# Patient Record
Sex: Male | Born: 1945 | Race: White | Hispanic: No | Marital: Married | Smoking: Never smoker
Health system: Southern US, Community
[De-identification: ages and names within clinical notes are randomized; demographics above are authoritative.]

## PROBLEM LIST (undated history)

## (undated) DIAGNOSIS — E7800 Pure hypercholesterolemia, unspecified: Secondary | ICD-10-CM

## (undated) DIAGNOSIS — G43909 Migraine, unspecified, not intractable, without status migrainosus: Secondary | ICD-10-CM

## (undated) DIAGNOSIS — R7303 Prediabetes: Secondary | ICD-10-CM

## (undated) DIAGNOSIS — K635 Polyp of colon: Secondary | ICD-10-CM

## (undated) DIAGNOSIS — G479 Sleep disorder, unspecified: Secondary | ICD-10-CM

## (undated) DIAGNOSIS — K5790 Diverticulosis of intestine, part unspecified, without perforation or abscess without bleeding: Secondary | ICD-10-CM

## (undated) DIAGNOSIS — I10 Essential (primary) hypertension: Secondary | ICD-10-CM

## (undated) DIAGNOSIS — F419 Anxiety disorder, unspecified: Secondary | ICD-10-CM

## (undated) DIAGNOSIS — I25118 Atherosclerotic heart disease of native coronary artery with other forms of angina pectoris: Secondary | ICD-10-CM

## (undated) DIAGNOSIS — R011 Cardiac murmur, unspecified: Secondary | ICD-10-CM

## (undated) HISTORY — PX: APPENDECTOMY: SHX54

## (undated) HISTORY — PX: TONSILLECTOMY: SUR1361

---
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
## 2005-05-30 ENCOUNTER — Ambulatory Visit: Payer: Self-pay | Admitting: Gastroenterology

## 2009-08-25 HISTORY — PX: COLONOSCOPY: SHX174

## 2010-07-12 ENCOUNTER — Ambulatory Visit: Payer: Self-pay | Admitting: Gastroenterology

## 2013-12-04 DIAGNOSIS — E7800 Pure hypercholesterolemia, unspecified: Secondary | ICD-10-CM | POA: Insufficient documentation

## 2013-12-04 DIAGNOSIS — R519 Headache, unspecified: Secondary | ICD-10-CM | POA: Insufficient documentation

## 2013-12-04 DIAGNOSIS — G43909 Migraine, unspecified, not intractable, without status migrainosus: Secondary | ICD-10-CM | POA: Insufficient documentation

## 2013-12-04 DIAGNOSIS — I10 Essential (primary) hypertension: Secondary | ICD-10-CM | POA: Insufficient documentation

## 2013-12-04 DIAGNOSIS — F419 Anxiety disorder, unspecified: Secondary | ICD-10-CM | POA: Insufficient documentation

## 2015-03-13 ENCOUNTER — Ambulatory Visit
Admission: RE | Admit: 2015-03-13 | Discharge: 2015-03-13 | Disposition: A | Discharge status: Home / Self Care | Payer: Medicare Other | Source: Ambulatory Visit | Attending: Cardiology | Admitting: Cardiology

## 2015-03-13 ENCOUNTER — Encounter: Payer: Self-pay | Admitting: *Deleted

## 2015-03-13 ENCOUNTER — Encounter
Admission: RE | Disposition: A | Discharge status: Home / Self Care | Payer: Self-pay | Source: Ambulatory Visit | Attending: Cardiology

## 2015-03-13 DIAGNOSIS — I2510 Atherosclerotic heart disease of native coronary artery without angina pectoris: Secondary | ICD-10-CM | POA: Diagnosis not present

## 2015-03-13 DIAGNOSIS — G43909 Migraine, unspecified, not intractable, without status migrainosus: Secondary | ICD-10-CM | POA: Diagnosis not present

## 2015-03-13 DIAGNOSIS — R011 Cardiac murmur, unspecified: Secondary | ICD-10-CM | POA: Insufficient documentation

## 2015-03-13 DIAGNOSIS — E780 Pure hypercholesterolemia: Secondary | ICD-10-CM | POA: Insufficient documentation

## 2015-03-13 DIAGNOSIS — Z79899 Other long term (current) drug therapy: Secondary | ICD-10-CM | POA: Diagnosis not present

## 2015-03-13 DIAGNOSIS — Z882 Allergy status to sulfonamides status: Secondary | ICD-10-CM | POA: Diagnosis not present

## 2015-03-13 DIAGNOSIS — Z86010 Personal history of colonic polyps: Secondary | ICD-10-CM | POA: Insufficient documentation

## 2015-03-13 DIAGNOSIS — I200 Unstable angina: Secondary | ICD-10-CM

## 2015-03-13 DIAGNOSIS — Z800 Family history of malignant neoplasm of digestive organs: Secondary | ICD-10-CM | POA: Diagnosis not present

## 2015-03-13 DIAGNOSIS — Z820 Family history of epilepsy and other diseases of the nervous system: Secondary | ICD-10-CM | POA: Insufficient documentation

## 2015-03-13 DIAGNOSIS — I10 Essential (primary) hypertension: Secondary | ICD-10-CM | POA: Insufficient documentation

## 2015-03-13 DIAGNOSIS — F419 Anxiety disorder, unspecified: Secondary | ICD-10-CM | POA: Diagnosis not present

## 2015-03-13 DIAGNOSIS — Z8041 Family history of malignant neoplasm of ovary: Secondary | ICD-10-CM | POA: Diagnosis not present

## 2015-03-13 DIAGNOSIS — Z8489 Family history of other specified conditions: Secondary | ICD-10-CM | POA: Insufficient documentation

## 2015-03-13 DIAGNOSIS — R079 Chest pain, unspecified: Secondary | ICD-10-CM | POA: Diagnosis present

## 2015-03-13 DIAGNOSIS — E785 Hyperlipidemia, unspecified: Secondary | ICD-10-CM | POA: Diagnosis not present

## 2015-03-13 HISTORY — DX: Anxiety disorder, unspecified: F41.9

## 2015-03-13 HISTORY — DX: Polyp of colon: K63.5

## 2015-03-13 HISTORY — DX: Migraine, unspecified, not intractable, without status migrainosus: G43.909

## 2015-03-13 HISTORY — PX: CARDIAC CATHETERIZATION: SHX172

## 2015-03-13 HISTORY — DX: Essential (primary) hypertension: I10

## 2015-03-13 HISTORY — DX: Cardiac murmur, unspecified: R01.1

## 2015-03-13 HISTORY — DX: Pure hypercholesterolemia, unspecified: E78.00

## 2015-03-13 SURGERY — LEFT HEART CATH
Anesthesia: Moderate Sedation

## 2015-03-13 MED ORDER — LIDOCAINE HCL (PF) 1 % IJ SOLN
INTRAMUSCULAR | Status: DC | PRN
  Administered 2015-03-13: 15 mL via SUBCUTANEOUS

## 2015-03-13 MED ORDER — SODIUM CHLORIDE 0.9 % IJ SOLN: 3.0000 mL | Freq: Two times a day (BID) | INTRAMUSCULAR | Status: DC

## 2015-03-13 MED ORDER — SODIUM CHLORIDE 0.9 % IJ SOLN: 10.0000 mL | Freq: Two times a day (BID) | INTRAMUSCULAR | Status: DC

## 2015-03-13 MED ORDER — SODIUM CHLORIDE 0.9 % IJ SOLN: 3.0000 mL | INTRAMUSCULAR | Status: DC | PRN

## 2015-03-13 MED ORDER — SODIUM CHLORIDE 0.9 % IV SOLN: 250.0000 mL | INTRAVENOUS | Status: DC | PRN

## 2015-03-13 MED ORDER — SODIUM CHLORIDE 0.9 % IV SOLN
INTRAVENOUS | Status: DC
  Administered 2015-03-13: 07:00:00 via INTRAVENOUS

## 2015-03-13 MED ORDER — MIDAZOLAM HCL 2 MG/2ML IJ SOLN
INTRAMUSCULAR | Status: AC
  Filled 2015-03-13: qty 2

## 2015-03-13 MED ORDER — SODIUM CHLORIDE 0.9 % WEIGHT BASED INFUSION: 3.0000 mL/kg/h | INTRAVENOUS | Status: DC

## 2015-03-13 MED ORDER — FENTANYL CITRATE (PF) 100 MCG/2ML IJ SOLN
INTRAMUSCULAR | Status: DC | PRN
  Administered 2015-03-13 (×2): 50 ug via INTRAVENOUS

## 2015-03-13 MED ORDER — FENTANYL CITRATE (PF) 100 MCG/2ML IJ SOLN
INTRAMUSCULAR | Status: AC
  Filled 2015-03-13: qty 2

## 2015-03-13 MED ORDER — HEPARIN (PORCINE) IN NACL 2-0.9 UNIT/ML-% IJ SOLN
INTRAMUSCULAR | Status: AC
  Filled 2015-03-13: qty 1000

## 2015-03-13 MED ORDER — ACETAMINOPHEN 325 MG PO TABS: 650.0000 mg | ORAL_TABLET | ORAL | Status: DC | PRN

## 2015-03-13 MED ORDER — ONDANSETRON HCL 4 MG/2ML IJ SOLN: 4.0000 mg | Freq: Four times a day (QID) | INTRAMUSCULAR | Status: DC | PRN

## 2015-03-13 MED ORDER — MIDAZOLAM HCL 2 MG/2ML IJ SOLN
INTRAMUSCULAR | Status: DC | PRN
  Administered 2015-03-13: 1 mg via INTRAVENOUS
  Administered 2015-03-13: 0.5 mg via INTRAVENOUS

## 2015-03-13 SURGICAL SUPPLY — 9 items
CATH INFINITI 5FR ANG PIGTAIL (CATHETERS) ×3 IMPLANT
CATH INFINITI 5FR JL4 (CATHETERS) ×3 IMPLANT
CATH INFINITI JR4 5F (CATHETERS) ×3 IMPLANT
DEVICE CLOSURE MYNXGRIP 5F (Vascular Products) ×3 IMPLANT
KIT MANI 3VAL PERCEP (MISCELLANEOUS) ×3 IMPLANT
NEEDLE PERC 18GX7CM (NEEDLE) ×3 IMPLANT
PACK CARDIAC CATH (CUSTOM PROCEDURE TRAY) ×3 IMPLANT
SHEATH AVANTI 5FR X 11CM (SHEATH) ×3 IMPLANT
WIRE EMERALD 3MM-J .035X150CM (WIRE) ×3 IMPLANT

## 2015-03-13 NOTE — Interval H&P Note (Signed)
History and Physical Interval Note:  03/13/2015 7:03 AM  Ronnie Hanson  has presented today for surgery, with the diagnosis of Chest pain  Abnormal functional study  The various methods of treatment have been discussed with the patient and family. After consideration of risks, benefits and other options for treatment, the patient has consented to  Procedure(s): Left Heart Cath (N/A) as a surgical intervention .  The patient's history has been reviewed, patient examined, no change in status, stable for surgery.  I have reviewed the patient's chart and labs.  Questions were answered to the patient's satisfaction.     Mollee Neer

## 2015-03-13 NOTE — H&P (Signed)
Progress Notes   Ronnie Hanson (MR# [id number])        Progress Notes Info     Author Note Status Last Update User Last Update Date/Time Service Date   Ronnie Cowman, MD Signed Ronnie Cowman, MD Fri Mar 02, 2015 10:03 AM Fri Mar 02, 2015 9:56 AM    Progress Notes    Expand All Collapse All   Established Patient Visit   Chief Complaint: Chief Complaint  Patient presents with  . Follow-up    ETT   Date of Service: 03/02/2015 Date of Birth: 20-Jan-1946 PCP: Ronnie Essex, III, MD  History of Present Illness: Ronnie Hanson is a 69 y.o.male patient who returns for evaluation of chest pain, essential hypertension and hyperlipidemia. The patient has a 9 month history of new onset exertional chest pain. He underwent ETT on 01/29/2015 which was stopped due to 10/10 chest pain. No ischemic ECG changes were noted. Patient continues experience recurrent episodes of chest discomfort, described as substernal chest tightness, which occurs with exertion, with such activities as mowing the lawn. Chest pain is not associated with nausea, vomiting or radiation. Episodes typically last 5 minutes. The patient denies palpitations, heart racing or peripheral edema. He is very active though he does not do any structured exercise. The patient underwent ETT sestamibi study on 02/21/2015 and exercised 5 minutes on a Bruce protocol with chest discomfort. Gated scintigraphy revealed LV ejection fraction 64%. SPECT imaging revealed evidence for mild to moderate inferior wall ischemia.  The patient has essential hypertension, blood pressure appears to be under good control, currently on atenolol/chlorthalidone. The patient follows a low-sodium, no added salt diet.  The patient has hyperlipidemia, LDL cholesterol was 90, on 01/09/2015, currently on atorvastatin, which is tolerated well without apparent side effects. The patient follows a low-cholesterol diet.  Past  Medical and Surgical History  Past Medical History Past Medical History  Diagnosis Date  . Heart murmur   . Migraine   . Hypertension   . Colonic polyp   . Anxiety     secondary to stress at work  . Hypercholesteremia     Past Surgical History He has past surgical history that includes Appendectomy and Tonsillectomy.   Medications and Allergies  Current Medications   Current Medications    Current Outpatient Prescriptions  Medication Sig Dispense Refill  . ALPRAZolam (XANAX) 0.25 MG tablet Take 1 tablet (0.25 mg total) by mouth nightly as needed for Sleep. 30 tablet 0  . atenolol-chlorthalidone (TENORETIC) 50-25 mg tablet Take 1 tablet by mouth once daily. 90 tablet 3  . HYDROcodone-acetaminophen (NORCO) 5-325 mg tablet Take by mouth as needed for Pain (rarely use).     . sertraline (ZOLOFT) 50 MG tablet Take 1 tablet (50 mg total) by mouth once daily. 15 tablet 0  . simvastatin (ZOCOR) 20 MG tablet Take 1 tablet (20 mg total) by mouth once daily. NOTE NEW DOSE NOW DOWN TO 20MG  INSTEAD OF 40MG  90 tablet 3   No current facility-administered medications for this visit.      Allergies: Sulfa (sulfonamide antibiotics)  Social and Family History  Social History  reports that he has never smoked. He does not have any smokeless tobacco history on file. He reports that he does not drink alcohol.  Family History Family History  Problem Relation Age of Onset  . Colon cancer Mother   . Alzheimer's disease Mother   . Ovarian cancer Mother   . Parkinsonism Father     Review of  Systems   Review of Systems: The patient reports recurrent exertional chest pain, without shortness of breath, orthopnea, paroxysmal nocturnal dyspnea, pedal edema, palpitations, heart racing, presyncope, syncope. Review of 12 Systems is negative except as described above.  Physical Examination   Vitals:BP 132/62  mmHg  Pulse 74  Ht 170.2 cm (5\' 7" )  Wt 70.035 kg (154 lb 6.4 oz)  BMI 24.18 kg/m2 Ht:170.2 cm (5\' 7" ) Wt:70.035 kg (154 lb 6.4 oz) PHX:TAVW surface area is 1.82 meters squared. Body mass index is 24.18 kg/(m^2).  HEENT: Pupils equally reactive to light and accomodation Neck: Supple without thyromegaly, carotid pulses 2+ Lungs: clear to auscultation bilaterally; no wheezes, rales, rhonchi Heart: Regular rate and rhythm. No gallops, murmurs or rub Abdomen: soft nontender, nondistended, with normal bowel sounds Extremities: no cyanosis, clubbing, or edema Peripheral Pulses: 2+ in all extremities, 2+ femoral pulses bilaterally  Assessment   69 y.o. male with  1. Essential hypertension, benign   2. Hypercholesteremia   3. Chest pain with high risk for cardiac etiology   4. Pre-op examination    69 year old gentleman with multiple cardiovascular risk factors, new onset exertional chest pain with ETT sestamibi study revealing evidence for mild to moderate inferior wall ischemia. The patient has Ronnie Hanson class 2-3 chest pain with moderate risk stress test. Patient has essential hypertension, blood pressure appears to be under good control current BP medications. The patient's hyperlipidemia, with good control of LDL cholesterol on simvastatin.   Plan   1. Continue current medications 2. Proceed with cardiac catheterization with selective coronary arteriography. We discussed the risks, benefits and alternatives of cardiac catheterization, as well as, the potential for percutaneous coronary intervention and coronary stenting. The patient agrees and informed written consent was obtained. 3. Counseled patient about low-sodium diet 4. Ronnie Hanson given to the patient 5. Counseled patient about low-cholesterol diet 6. Continue simvastatin for hyperlipidemia management 7. Low-fat and cholesterol diet printed Hanson given to the patient 8. Return  to clinic following cardiac catheterization and potential PCI Orders Placed This Encounter  Procedures  . Complete Blood Count (CBC)  . Basic Metabolic Panel (BMP)    No Follow-up on file.  Ronnie Hanson, [facility]   [address] [place] [address]    Service Location    Name Address       [address] [place] [address]      Department    Name Address Phone Fax   [facility] [place] [address] [phone]

## 2015-03-20 DIAGNOSIS — Z9889 Other specified postprocedural states: Secondary | ICD-10-CM | POA: Insufficient documentation

## 2015-07-04 DIAGNOSIS — I25118 Atherosclerotic heart disease of native coronary artery with other forms of angina pectoris: Secondary | ICD-10-CM | POA: Insufficient documentation

## 2015-07-27 ENCOUNTER — Other Ambulatory Visit: Payer: Self-pay

## 2015-07-27 NOTE — Telephone Encounter (Signed)
Please advise as prescription was faxed from [facility] for this medication yet it is not on the patient's list.  Unsure on the dose and strengths.  Thanks

## 2015-07-27 NOTE — Telephone Encounter (Signed)
I have not ever seen this pt.

## 2015-09-06 ENCOUNTER — Encounter: Payer: Self-pay | Admitting: *Deleted

## 2015-09-07 ENCOUNTER — Ambulatory Visit: Payer: Medicare Other | Admitting: Certified Registered"

## 2015-09-07 ENCOUNTER — Ambulatory Visit
Admission: RE | Admit: 2015-09-07 | Discharge: 2015-09-07 | Disposition: A | Discharge status: Home / Self Care | Payer: Medicare Other | Source: Ambulatory Visit | Attending: Gastroenterology | Admitting: Gastroenterology

## 2015-09-07 ENCOUNTER — Encounter
Admission: RE | Disposition: A | Discharge status: Home / Self Care | Payer: Self-pay | Source: Ambulatory Visit | Attending: Gastroenterology

## 2015-09-07 DIAGNOSIS — K5730 Diverticulosis of large intestine without perforation or abscess without bleeding: Secondary | ICD-10-CM | POA: Insufficient documentation

## 2015-09-07 DIAGNOSIS — Z820 Family history of epilepsy and other diseases of the nervous system: Secondary | ICD-10-CM | POA: Insufficient documentation

## 2015-09-07 DIAGNOSIS — Z86010 Personal history of colonic polyps: Secondary | ICD-10-CM | POA: Insufficient documentation

## 2015-09-07 DIAGNOSIS — D123 Benign neoplasm of transverse colon: Secondary | ICD-10-CM | POA: Insufficient documentation

## 2015-09-07 DIAGNOSIS — Z7982 Long term (current) use of aspirin: Secondary | ICD-10-CM | POA: Diagnosis not present

## 2015-09-07 DIAGNOSIS — I10 Essential (primary) hypertension: Secondary | ICD-10-CM | POA: Insufficient documentation

## 2015-09-07 DIAGNOSIS — Z882 Allergy status to sulfonamides status: Secondary | ICD-10-CM | POA: Diagnosis not present

## 2015-09-07 DIAGNOSIS — E7800 Pure hypercholesterolemia, unspecified: Secondary | ICD-10-CM | POA: Insufficient documentation

## 2015-09-07 DIAGNOSIS — Z800 Family history of malignant neoplasm of digestive organs: Secondary | ICD-10-CM | POA: Insufficient documentation

## 2015-09-07 DIAGNOSIS — F419 Anxiety disorder, unspecified: Secondary | ICD-10-CM | POA: Diagnosis not present

## 2015-09-07 DIAGNOSIS — Z8041 Family history of malignant neoplasm of ovary: Secondary | ICD-10-CM | POA: Diagnosis not present

## 2015-09-07 DIAGNOSIS — Z79899 Other long term (current) drug therapy: Secondary | ICD-10-CM | POA: Diagnosis not present

## 2015-09-07 DIAGNOSIS — R011 Cardiac murmur, unspecified: Secondary | ICD-10-CM | POA: Diagnosis not present

## 2015-09-07 DIAGNOSIS — G43909 Migraine, unspecified, not intractable, without status migrainosus: Secondary | ICD-10-CM | POA: Diagnosis not present

## 2015-09-07 HISTORY — PX: COLONOSCOPY: SHX5424

## 2015-09-07 SURGERY — COLONOSCOPY
Anesthesia: General

## 2015-09-07 MED ORDER — SODIUM CHLORIDE 0.9 % IV SOLN: INTRAVENOUS | Status: DC

## 2015-09-07 MED ORDER — LIDOCAINE HCL (CARDIAC) 20 MG/ML IV SOLN
INTRAVENOUS | Status: DC | PRN
  Administered 2015-09-07: 60 mg via INTRAVENOUS

## 2015-09-07 MED ORDER — EPHEDRINE SULFATE 50 MG/ML IJ SOLN
INTRAMUSCULAR | Status: DC | PRN
  Administered 2015-09-07: 10 mg via INTRAVENOUS

## 2015-09-07 MED ORDER — METOPROLOL SUCCINATE ER 50 MG PO TB24
ORAL_TABLET | ORAL | Status: AC
  Administered 2015-09-07: 50 mg via ORAL
  Filled 2015-09-07: qty 1

## 2015-09-07 MED ORDER — PROPOFOL 500 MG/50ML IV EMUL
INTRAVENOUS | Status: DC | PRN
  Administered 2015-09-07: 150 ug/kg/min via INTRAVENOUS

## 2015-09-07 MED ORDER — SODIUM CHLORIDE 0.9 % IV SOLN
INTRAVENOUS | Status: DC
  Administered 2015-09-07: 1000 mL via INTRAVENOUS

## 2015-09-07 NOTE — H&P (Signed)
  Date of Initial H&P:08/10/2015  History reviewed, patient examined, no change in status, stable for surgery.

## 2015-09-07 NOTE — Op Note (Signed)
[facility] Gastroenterology Patient Name: Ronnie Hanson Procedure Date: 09/07/2015 10:44 AM MRN: [id number] Account #: [id number] Date of Birth: 1946-05-27 Admit Type: Outpatient Age: 70 Room: [facility] ENDO ROOM 4 Gender: Male Note Status: Finalized Procedure:         Colonoscopy Indications:       Personal history of colonic polyps Providers:         Lupita Dawn. Candace Cruise, MD Referring MD:      Ocie Cornfield. Ouida Sills, MD (Referring MD) Medicines:         Monitored Anesthesia Care Complications:     No immediate complications. Procedure:         Pre-Anesthesia Assessment:                    - Prior to the procedure, a History and Physical was                     performed, and patient medications, allergies and                     sensitivities were reviewed. The patient's tolerance of                     previous anesthesia was reviewed.                    - The risks and benefits of the procedure and the sedation                     options and risks were discussed with the patient. All                     questions were answered and informed consent was obtained.                    - After reviewing the risks and benefits, the patient was                     deemed in satisfactory condition to undergo the procedure.                    After obtaining informed consent, the colonoscope was                     passed under direct vision. Throughout the procedure, the                     patient's blood pressure, pulse, and oxygen saturations                     were monitored continuously. The Colonoscope was                     introduced through the anus and advanced to the the cecum,                     identified by appendiceal orifice and ileocecal valve. The                     colonoscopy was performed without difficulty. The patient                     tolerated the procedure well. The quality of the bowel  preparation was good. Findings:  Multiple small-mouthed diverticula were found in the sigmoid colon.      A diminutive polyp was found in the transverse colon. The polyp was       sessile. The polyp was removed with a jumbo cold forceps. Resection and       retrieval were complete.      The exam was otherwise without abnormality. Impression:        - Diverticulosis in the sigmoid colon.                    - One diminutive polyp in the transverse colon. Resected                     and retrieved.                    - The examination was otherwise normal. Recommendation:    - Discharge patient to home.                    - Await pathology results.                    - Repeat colonoscopy in 5 years for surveillance based on                     pathology results.                    - The findings and recommendations were discussed with the                     patient. Procedure Code(s): --- Professional ---                    [phone], Colonoscopy, flexible; with biopsy, single or                     multiple Diagnosis Code(s): --- Professional ---                    D12.3, Benign neoplasm of transverse colon                    Z86.010, Personal history of colonic polyps                    K57.30, Diverticulosis of large intestine without                     perforation or abscess without bleeding CPT copyright 2014 American Medical Association. All rights reserved. The codes documented in this report are preliminary and upon coder review may  be revised to meet current compliance requirements. Hulen Luster, MD 09/07/2015 11:17:16 AM This report has been signed electronically. Number of Addenda: 0 Note Initiated On: 09/07/2015 10:44 AM Scope Withdrawal Time: 0 hours 8 minutes 27 seconds  Total Procedure Duration: 0 hours 22 minutes 3 seconds       [facility]

## 2015-09-07 NOTE — Anesthesia Preprocedure Evaluation (Signed)
Anesthesia Evaluation  Patient identified by MRN, date of birth, ID band Patient awake    Reviewed: Allergy & Precautions, H&P , NPO status , Patient's Chart, lab work & pertinent test results, reviewed documented beta blocker date and time   Airway Mallampati: II  TM Distance: >3 FB Neck ROM: full    Dental no notable dental hx.    Pulmonary neg pulmonary ROS,    Pulmonary exam normal breath sounds clear to auscultation       Cardiovascular Exercise Tolerance: Good hypertension, On Medications + angina negative cardio ROS  + Valvular Problems/Murmurs  Rhythm:regular Rate:Normal  Previous cath reveals 2 vessel nonsurgical disease   Neuro/Psych  Headaches, negative neurological ROS  negative psych ROS   GI/Hepatic negative GI ROS, Neg liver ROS,   Endo/Other  negative endocrine ROS  Renal/GU negative Renal ROS  negative genitourinary   Musculoskeletal   Abdominal   Peds  Hematology negative hematology ROS (+)   Anesthesia Other Findings   Reproductive/Obstetrics negative OB ROS                             Anesthesia Physical Anesthesia Plan  ASA: III  Anesthesia Plan: General   Post-op Pain Management:    Induction:   Airway Management Planned:   Additional Equipment:   Intra-op Plan:   Post-operative Plan:   Informed Consent: I have reviewed the patients History and Physical, chart, labs and discussed the procedure including the risks, benefits and alternatives for the proposed anesthesia with the patient or authorized representative who has indicated his/her understanding and acceptance.   Dental Advisory Given  Plan Discussed with: CRNA  Anesthesia Plan Comments:         Anesthesia Quick Evaluation

## 2015-09-07 NOTE — Transfer of Care (Signed)
Immediate Anesthesia Transfer of Care Note  Patient: Ronnie Hanson  Procedure(s) Performed: Procedure(s): COLONOSCOPY (N/A)  Patient Location: Endoscopy Unit  Anesthesia Type:General  Level of Consciousness: awake, alert , oriented and patient cooperative  Airway & Oxygen Therapy: Patient Spontanous Breathing and Patient connected to nasal cannula oxygen  Post-op Assessment: Report given to RN, Post -op Vital signs reviewed and stable and Patient moving all extremities X 4  Post vital signs: Reviewed and stable  Last Vitals:  Filed Vitals:   09/07/15 0938  BP: 125/77  Pulse: 68  Temp: 36.3 C  Resp: 20    Complications: No apparent anesthesia complications

## 2015-09-09 NOTE — Anesthesia Postprocedure Evaluation (Signed)
Anesthesia Post Note  Patient: Ronnie Hanson  Procedure(s) Performed: Procedure(s) (LRB): COLONOSCOPY (N/A)  Patient location during evaluation: PACU Anesthesia Type: General Level of consciousness: awake and alert Pain management: pain level controlled Vital Signs Assessment: post-procedure vital signs reviewed and stable Respiratory status: spontaneous breathing, nonlabored ventilation, respiratory function stable and patient connected to nasal cannula oxygen Cardiovascular status: blood pressure returned to baseline and stable Postop Assessment: no signs of nausea or vomiting Anesthetic complications: no    Last Vitals:  Filed Vitals:   09/07/15 1140 09/07/15 1150  BP: 112/73 113/73  Pulse: 80 67  Temp:    Resp: 17 14    Last Pain:  Filed Vitals:   09/08/15 1522  PainSc: 0-No pain                 Molli Barrows

## 2015-09-10 LAB — SURGICAL PATHOLOGY

## 2015-09-12 ENCOUNTER — Encounter: Payer: Self-pay | Admitting: Gastroenterology

## 2017-01-20 DIAGNOSIS — Z0000 Encounter for general adult medical examination without abnormal findings: Secondary | ICD-10-CM | POA: Insufficient documentation

## 2018-01-21 DIAGNOSIS — R7303 Prediabetes: Secondary | ICD-10-CM | POA: Insufficient documentation

## 2018-09-29 DIAGNOSIS — L570 Actinic keratosis: Secondary | ICD-10-CM | POA: Diagnosis not present

## 2018-09-29 DIAGNOSIS — D1800 Hemangioma unspecified site: Secondary | ICD-10-CM | POA: Diagnosis not present

## 2018-09-29 DIAGNOSIS — L821 Other seborrheic keratosis: Secondary | ICD-10-CM | POA: Diagnosis not present

## 2018-09-29 DIAGNOSIS — L219 Seborrheic dermatitis, unspecified: Secondary | ICD-10-CM | POA: Diagnosis not present

## 2018-09-29 DIAGNOSIS — Z85828 Personal history of other malignant neoplasm of skin: Secondary | ICD-10-CM | POA: Diagnosis not present

## 2018-09-29 DIAGNOSIS — D229 Melanocytic nevi, unspecified: Secondary | ICD-10-CM | POA: Diagnosis not present

## 2018-09-29 DIAGNOSIS — L814 Other melanin hyperpigmentation: Secondary | ICD-10-CM | POA: Diagnosis not present

## 2018-09-29 DIAGNOSIS — Z1283 Encounter for screening for malignant neoplasm of skin: Secondary | ICD-10-CM | POA: Diagnosis not present

## 2018-09-29 DIAGNOSIS — B078 Other viral warts: Secondary | ICD-10-CM | POA: Diagnosis not present

## 2019-01-25 DIAGNOSIS — I2510 Atherosclerotic heart disease of native coronary artery without angina pectoris: Secondary | ICD-10-CM | POA: Diagnosis not present

## 2019-01-25 DIAGNOSIS — I10 Essential (primary) hypertension: Secondary | ICD-10-CM | POA: Diagnosis not present

## 2019-01-25 DIAGNOSIS — Z125 Encounter for screening for malignant neoplasm of prostate: Secondary | ICD-10-CM | POA: Diagnosis not present

## 2019-01-25 DIAGNOSIS — R7309 Other abnormal glucose: Secondary | ICD-10-CM | POA: Diagnosis not present

## 2019-02-01 DIAGNOSIS — E7800 Pure hypercholesterolemia, unspecified: Secondary | ICD-10-CM | POA: Diagnosis not present

## 2019-02-01 DIAGNOSIS — R7309 Other abnormal glucose: Secondary | ICD-10-CM | POA: Diagnosis not present

## 2019-02-01 DIAGNOSIS — I2510 Atherosclerotic heart disease of native coronary artery without angina pectoris: Secondary | ICD-10-CM | POA: Diagnosis not present

## 2019-02-01 DIAGNOSIS — G629 Polyneuropathy, unspecified: Secondary | ICD-10-CM | POA: Diagnosis not present

## 2019-02-01 DIAGNOSIS — I10 Essential (primary) hypertension: Secondary | ICD-10-CM | POA: Diagnosis not present

## 2019-02-08 DIAGNOSIS — I2510 Atherosclerotic heart disease of native coronary artery without angina pectoris: Secondary | ICD-10-CM | POA: Diagnosis not present

## 2019-02-08 DIAGNOSIS — E7800 Pure hypercholesterolemia, unspecified: Secondary | ICD-10-CM | POA: Diagnosis not present

## 2019-02-08 DIAGNOSIS — I10 Essential (primary) hypertension: Secondary | ICD-10-CM | POA: Diagnosis not present

## 2019-02-08 DIAGNOSIS — Z9889 Other specified postprocedural states: Secondary | ICD-10-CM | POA: Diagnosis not present

## 2019-03-15 DIAGNOSIS — X32XXXA Exposure to sunlight, initial encounter: Secondary | ICD-10-CM | POA: Diagnosis not present

## 2019-03-15 DIAGNOSIS — L820 Inflamed seborrheic keratosis: Secondary | ICD-10-CM | POA: Diagnosis not present

## 2019-03-15 DIAGNOSIS — Z08 Encounter for follow-up examination after completed treatment for malignant neoplasm: Secondary | ICD-10-CM | POA: Diagnosis not present

## 2019-03-15 DIAGNOSIS — D2339 Other benign neoplasm of skin of other parts of face: Secondary | ICD-10-CM | POA: Diagnosis not present

## 2019-03-15 DIAGNOSIS — L821 Other seborrheic keratosis: Secondary | ICD-10-CM | POA: Diagnosis not present

## 2019-03-15 DIAGNOSIS — L218 Other seborrheic dermatitis: Secondary | ICD-10-CM | POA: Diagnosis not present

## 2019-03-15 DIAGNOSIS — L570 Actinic keratosis: Secondary | ICD-10-CM | POA: Diagnosis not present

## 2019-03-15 DIAGNOSIS — Z85828 Personal history of other malignant neoplasm of skin: Secondary | ICD-10-CM | POA: Diagnosis not present

## 2019-03-28 DIAGNOSIS — R7309 Other abnormal glucose: Secondary | ICD-10-CM | POA: Diagnosis not present

## 2019-03-28 DIAGNOSIS — E7800 Pure hypercholesterolemia, unspecified: Secondary | ICD-10-CM | POA: Diagnosis not present

## 2019-03-28 DIAGNOSIS — I10 Essential (primary) hypertension: Secondary | ICD-10-CM | POA: Diagnosis not present

## 2019-03-28 DIAGNOSIS — I2510 Atherosclerotic heart disease of native coronary artery without angina pectoris: Secondary | ICD-10-CM | POA: Diagnosis not present

## 2019-03-28 DIAGNOSIS — G629 Polyneuropathy, unspecified: Secondary | ICD-10-CM | POA: Diagnosis not present

## 2019-07-28 DIAGNOSIS — I10 Essential (primary) hypertension: Secondary | ICD-10-CM | POA: Diagnosis not present

## 2019-07-28 DIAGNOSIS — R7309 Other abnormal glucose: Secondary | ICD-10-CM | POA: Diagnosis not present

## 2019-07-28 DIAGNOSIS — E7800 Pure hypercholesterolemia, unspecified: Secondary | ICD-10-CM | POA: Diagnosis not present

## 2019-07-28 DIAGNOSIS — I2510 Atherosclerotic heart disease of native coronary artery without angina pectoris: Secondary | ICD-10-CM | POA: Diagnosis not present

## 2019-08-03 DIAGNOSIS — I2510 Atherosclerotic heart disease of native coronary artery without angina pectoris: Secondary | ICD-10-CM | POA: Diagnosis not present

## 2019-08-03 DIAGNOSIS — G629 Polyneuropathy, unspecified: Secondary | ICD-10-CM | POA: Diagnosis not present

## 2019-08-03 DIAGNOSIS — R7309 Other abnormal glucose: Secondary | ICD-10-CM | POA: Diagnosis not present

## 2019-08-03 DIAGNOSIS — Z0000 Encounter for general adult medical examination without abnormal findings: Secondary | ICD-10-CM | POA: Diagnosis not present

## 2019-08-11 DIAGNOSIS — E7800 Pure hypercholesterolemia, unspecified: Secondary | ICD-10-CM | POA: Diagnosis not present

## 2019-08-11 DIAGNOSIS — I2510 Atherosclerotic heart disease of native coronary artery without angina pectoris: Secondary | ICD-10-CM | POA: Diagnosis not present

## 2019-08-11 DIAGNOSIS — I10 Essential (primary) hypertension: Secondary | ICD-10-CM | POA: Diagnosis not present

## 2019-08-11 DIAGNOSIS — Z9889 Other specified postprocedural states: Secondary | ICD-10-CM | POA: Diagnosis not present

## 2019-08-21 ENCOUNTER — Emergency Department
Admission: EM | Admit: 2019-08-21 | Discharge: 2019-08-21 | Disposition: A | Discharge status: Home / Self Care | Payer: Medicare HMO | Attending: Emergency Medicine | Admitting: Emergency Medicine

## 2019-08-21 ENCOUNTER — Encounter: Payer: Self-pay | Admitting: Emergency Medicine

## 2019-08-21 ENCOUNTER — Other Ambulatory Visit: Payer: Self-pay

## 2019-08-21 ENCOUNTER — Emergency Department: Payer: Medicare HMO

## 2019-08-21 DIAGNOSIS — S0990XA Unspecified injury of head, initial encounter: Secondary | ICD-10-CM

## 2019-08-21 DIAGNOSIS — I10 Essential (primary) hypertension: Secondary | ICD-10-CM | POA: Insufficient documentation

## 2019-08-21 DIAGNOSIS — Y92017 Garden or yard in single-family (private) house as the place of occurrence of the external cause: Secondary | ICD-10-CM | POA: Diagnosis not present

## 2019-08-21 DIAGNOSIS — Y998 Other external cause status: Secondary | ICD-10-CM | POA: Insufficient documentation

## 2019-08-21 DIAGNOSIS — W208XXA Other cause of strike by thrown, projected or falling object, initial encounter: Secondary | ICD-10-CM | POA: Diagnosis not present

## 2019-08-21 DIAGNOSIS — S0101XA Laceration without foreign body of scalp, initial encounter: Secondary | ICD-10-CM | POA: Insufficient documentation

## 2019-08-21 DIAGNOSIS — Y9389 Activity, other specified: Secondary | ICD-10-CM | POA: Diagnosis not present

## 2019-08-21 DIAGNOSIS — Z79899 Other long term (current) drug therapy: Secondary | ICD-10-CM | POA: Diagnosis not present

## 2019-08-21 MED ORDER — TRAMADOL HCL 50 MG PO TABS: 50.0000 mg | ORAL_TABLET | Freq: Four times a day (QID) | ORAL | 0 refills | Status: DC | PRN

## 2019-08-21 NOTE — ED Provider Notes (Signed)
[facility] Emergency Department Provider Note  ____________________________________________  Time seen: Approximately 5:08 PM  I have reviewed the triage vital signs and the nursing notes.   HISTORY  Chief Complaint Head Injury    HPI Ronnie Hanson is a 73 y.o. male who presents the emergency department complaining of head trauma.  Patient was attempting to mount a bird house on a pole, but the first screw into the bird house holding it, climb down the ladder to reposition it when it fell striking him in the right side of his head.  Patient did not lose consciousness but is complaining of pain to the right scalp.  No subsequent loss of consciousness.  Patient denies any other trauma or injuries.  No medications prior to arrival.  Patient has a history of anxiety, hypercholesterolemia and hypertension but denies any complaints of chronic medical problems.        Past Medical History:  Diagnosis Date  . Anxiety   . Colonic polyp   . Heart murmur   . Hypercholesteremia   . Hypertension   . Migraine     Patient Active Problem List   Diagnosis Date Noted  . Unstable angina ([place]) 03/13/2015    Past Surgical History:  Procedure Laterality Date  . APPENDECTOMY    . CARDIAC CATHETERIZATION N/A 03/13/2015   Procedure: Left Heart Cath;  Surgeon: Isaias Cowman, MD;  Location: [place] CV LAB;  Service: Cardiovascular;  Laterality: N/A;  . COLONOSCOPY N/A 09/07/2015   Procedure: COLONOSCOPY;  Surgeon: Hulen Luster, MD;  Location: [facility] ENDOSCOPY;  Service: Gastroenterology;  Laterality: N/A;  . TONSILLECTOMY      Prior to Admission medications   Medication Sig Start Date End Date Taking? Authorizing Provider  ALPRAZolam Duanne Moron) 0.25 MG tablet Take 0.25 mg by mouth at bedtime as needed for anxiety.    [provider]  atenolol-chlorthalidone (TENORETIC) 50-25 MG per tablet Take 1 tablet by mouth daily. Reported on 09/07/2015     [provider]  HYDROcodone-acetaminophen (NORCO/VICODIN) 5-325 MG per tablet Take 1 tablet by mouth every 6 (six) hours as needed for moderate pain.    [provider]  metoprolol succinate (TOPROL-XL) 50 MG 24 hr tablet Take 50 mg by mouth once. Take with or immediately following a meal.    [provider]  sertraline (ZOLOFT) 50 MG tablet Take 50 mg by mouth daily.    [provider]  simvastatin (ZOCOR) 20 MG tablet Take 20 mg by mouth daily.    [provider]  traMADol (ULTRAM) 50 MG tablet Take 1 tablet (50 mg total) by mouth every 6 (six) hours as needed. 08/21/19   Ronnie Hanson, Charline Bills, PA-C    Allergies Sulfa antibiotics  No family history on file.  Social History Social History   Tobacco Use  . Smoking status: Never Smoker  . Smokeless tobacco: Never Used  Substance Use Topics  . Alcohol use: No  . Drug use: No     Review of Systems  Constitutional: No fever/chills Eyes: No visual changes. No discharge ENT: No upper respiratory complaints. Cardiovascular: no chest pain. Respiratory: no cough. No SOB. Gastrointestinal: No abdominal pain.  No nausea, no vomiting.   Musculoskeletal: Negative for musculoskeletal pain. Skin: Positive for scalp laceration following head trauma Neurological: Negative for headaches, focal weakness or numbness. 10-point ROS otherwise negative.  ____________________________________________   PHYSICAL EXAM:  VITAL SIGNS: ED Triage Vitals  Enc Vitals Group     BP 08/21/19  1624 (!) 163/88     Pulse Rate 08/21/19 1624 67     Resp 08/21/19 1624 16     Temp 08/21/19 1624 98.3 F (36.8 C)     Temp Source 08/21/19 1624 Oral     SpO2 08/21/19 1624 100 %     Weight 08/21/19 1625 140 lb (63.5 kg)     Height 08/21/19 1625 5\' 7"  (1.702 m)     Head Circumference --      Peak Flow --      Pain Score 08/21/19 1625 6     Pain Loc --      Pain Edu? --      Excl. in [place]? --       Constitutional: Alert and oriented. Well appearing and in no acute distress. Eyes: Conjunctivae are normal. PERRL. EOMI. Head: Patient has a 4 cm laceration noted to the right parietal scalp.  Extreme ends of laceration are very superficial in nature quickly progressing into a deeper penetrating laceration.  Total length of deeper laceration measures approximately 2.5 cm in length.  No active bleeding.  No visible foreign body.  No hematoma.  No battle signs, raccoon eyes, serosanguineous fluid drainage from ears or nares.  Patient is tender to palpation over the laceration but no tenderness to palpation of the osseous structures of the skull.  No palpable abnormality or crepitus in this region. ENT:      Ears:       Nose: No congestion/rhinnorhea.      Mouth/Throat: Mucous membranes are moist.  Neck: No stridor.  No cervical spine tenderness to palpation.  Cardiovascular: Normal rate, regular rhythm. Normal S1 and S2.  Good peripheral circulation. Respiratory: Normal respiratory effort without tachypnea or retractions. Lungs CTAB. Good air entry to the bases with no decreased or absent breath sounds. Musculoskeletal: Full range of motion to all extremities. No gross deformities appreciated. Neurologic:  Normal speech and language. No gross focal neurologic deficits are appreciated.  Cranial nerves II through XII grossly intact. Skin:  Skin is warm, dry and intact. No rash noted. Psychiatric: Mood and affect are normal. Speech and behavior are normal. Patient exhibits appropriate insight and judgement.   ____________________________________________   LABS (all labs ordered are listed, but only abnormal results are displayed)  Labs Reviewed - No data to display ____________________________________________  EKG   ____________________________________________  RADIOLOGY I personally viewed and evaluated these images as part of my medical decision making, as well as reviewing the  written report by the radiologist.  CT Head Wo Contrast  Result Date: 08/21/2019 CLINICAL DATA:  73 year old male with trauma to the head. EXAM: CT HEAD WITHOUT CONTRAST TECHNIQUE: Contiguous axial images were obtained from the base of the skull through the vertex without intravenous contrast. COMPARISON:  None. FINDINGS: Brain: Mild age-related atrophy and chronic microvascular ischemic changes. There is no acute intracranial hemorrhage. No mass effect or midline shift. No extra-axial fluid collection. Vascular: No hyperdense vessel or unexpected calcification. Skull: Normal. Negative for fracture or focal lesion. Sinuses/Orbits: No acute finding. Cerumen noted in the left external auditory canal. Other: None IMPRESSION: No acute intracranial pathology. Electronically Signed   By: Anner Crete M.D.   On: 08/21/2019 18:00    ____________________________________________    PROCEDURES  Procedure(s) performed:    Marland KitchenMarland KitchenLaceration Repair  Date/Time: 08/21/2019 6:41 PM Performed by: Darletta Moll, PA-C Authorized by: Darletta Moll, PA-C   Consent:    Consent obtained:  Verbal   Consent given by:  Patient   Risks discussed:  Pain Anesthesia (see MAR for exact dosages):    Anesthesia method:  None Laceration details:    Location:  Scalp   Scalp location:  R parietal   Length (cm):  4 Repair type:    Repair type:  Simple Exploration:    Hemostasis achieved with:  Direct pressure   Wound exploration: wound explored through full range of motion and entire depth of wound probed and visualized     Wound extent: no foreign bodies/material noted, no muscle damage noted, no nerve damage noted, no tendon damage noted, no underlying fracture noted and no vascular damage noted     Contaminated: no   Treatment:    Area cleansed with:  Saline   Amount of cleaning:  Standard Skin repair:    Repair method:  Staples   Number of staples:  4 Approximation:    Approximation:   Close Post-procedure details:    Dressing:  Open (no dressing)   Patient tolerance of procedure:  Tolerated well, no immediate complications      Medications - No data to display   ____________________________________________   INITIAL IMPRESSION / ASSESSMENT AND PLAN / ED COURSE  Pertinent labs & imaging results that were available during my care of the patient were reviewed by me and considered in my medical decision making (see chart for details).  Review of the  [place]RS was performed in accordance of the [place] prior to dispensing any controlled drugs.           Patient's diagnosis is consistent with minor head injury with scalp laceration.  Patient presented to emergency department with an injury to the right side of the head after a bird house he was installing fell and struck him.  No loss of consciousness.  Patient was neurologically intact.  Patient did have a laceration to the right parietal skull.  Given the nature with penetration of likely corner of the bird house, CT scan was obtained to ensure no skull fracture or intracranial hemorrhaging.  CT is reassuring at this time patient remains neurologically intact.  Laceration is closed as described above.  Patient tolerated well.  Wound care instructions discussed with patient.  Follow-up with primary care in 1 week for staple removal.  Limited prescription for Ultram for pain.  Tylenol/Motrin for symptoms after use of Ultram..   Patient is given ED precautions to return to the ED for any worsening or new symptoms.     ____________________________________________  FINAL CLINICAL IMPRESSION(S) / ED DIAGNOSES  Final diagnoses:  Minor head injury, initial encounter  Laceration of scalp, initial encounter      NEW MEDICATIONS STARTED DURING THIS VISIT:  ED Discharge Orders         Ordered    traMADol (ULTRAM) 50 MG tablet  Every 6 hours PRN     08/21/19 1841              This chart was dictated using voice  recognition software/Dragon. Despite best efforts to proofread, errors can occur which can change the meaning. Any change was purely unintentional.    Darletta Moll, PA-C 08/21/19 1846    Nance Pear, MD 08/21/19 [phone]

## 2019-08-21 NOTE — ED Notes (Signed)
Pt did not want dressing on head, pt stated he was fine to walk to lobby. Gait steady, no c/o dizziness. Pt discharged.

## 2019-08-21 NOTE — ED Notes (Signed)
Pt states bird house fell from tree and hit his head as he was trying to put it up. Pt with 1 to 1.5 inch gash on top of head. Small amount of bloody drainage. Pt states he did not pass out and denies N/V

## 2019-08-21 NOTE — ED Triage Notes (Signed)
Patient presents to the ED with a laceration to the top of his head.  Patient states he was attempting to put up a bird house and the bird house fell approx. 6 feet onto his head.  Patient has a 1.5in laceration with closely approximated edges to the top of his head.  Bleeding is controlled at this time.  Patient denies loss of consciousness, denies dizziness.  Patient reports taking a baby aspirin daily.  No obvious distress, walking with a steady gait.

## 2019-08-29 DIAGNOSIS — S0990XA Unspecified injury of head, initial encounter: Secondary | ICD-10-CM | POA: Diagnosis not present

## 2019-08-29 DIAGNOSIS — W208XXA Other cause of strike by thrown, projected or falling object, initial encounter: Secondary | ICD-10-CM | POA: Diagnosis not present

## 2019-10-20 DIAGNOSIS — M79671 Pain in right foot: Secondary | ICD-10-CM | POA: Diagnosis not present

## 2019-10-20 DIAGNOSIS — R600 Localized edema: Secondary | ICD-10-CM | POA: Diagnosis not present

## 2019-10-20 DIAGNOSIS — L03031 Cellulitis of right toe: Secondary | ICD-10-CM | POA: Diagnosis not present

## 2020-01-19 DIAGNOSIS — I2510 Atherosclerotic heart disease of native coronary artery without angina pectoris: Secondary | ICD-10-CM | POA: Diagnosis not present

## 2020-01-19 DIAGNOSIS — R7309 Other abnormal glucose: Secondary | ICD-10-CM | POA: Diagnosis not present

## 2020-01-19 DIAGNOSIS — Z125 Encounter for screening for malignant neoplasm of prostate: Secondary | ICD-10-CM | POA: Diagnosis not present

## 2020-02-06 DIAGNOSIS — L821 Other seborrheic keratosis: Secondary | ICD-10-CM | POA: Diagnosis not present

## 2020-02-06 DIAGNOSIS — L570 Actinic keratosis: Secondary | ICD-10-CM | POA: Diagnosis not present

## 2020-02-06 DIAGNOSIS — D2261 Melanocytic nevi of right upper limb, including shoulder: Secondary | ICD-10-CM | POA: Diagnosis not present

## 2020-02-06 DIAGNOSIS — D2262 Melanocytic nevi of left upper limb, including shoulder: Secondary | ICD-10-CM | POA: Diagnosis not present

## 2020-02-06 DIAGNOSIS — D2271 Melanocytic nevi of right lower limb, including hip: Secondary | ICD-10-CM | POA: Diagnosis not present

## 2020-02-06 DIAGNOSIS — X32XXXA Exposure to sunlight, initial encounter: Secondary | ICD-10-CM | POA: Diagnosis not present

## 2020-02-06 DIAGNOSIS — Z85828 Personal history of other malignant neoplasm of skin: Secondary | ICD-10-CM | POA: Diagnosis not present

## 2020-02-10 DIAGNOSIS — E7800 Pure hypercholesterolemia, unspecified: Secondary | ICD-10-CM | POA: Diagnosis not present

## 2020-02-10 DIAGNOSIS — I2510 Atherosclerotic heart disease of native coronary artery without angina pectoris: Secondary | ICD-10-CM | POA: Diagnosis not present

## 2020-02-10 DIAGNOSIS — R7309 Other abnormal glucose: Secondary | ICD-10-CM | POA: Diagnosis not present

## 2020-02-10 DIAGNOSIS — I10 Essential (primary) hypertension: Secondary | ICD-10-CM | POA: Diagnosis not present

## 2020-02-15 DIAGNOSIS — I2510 Atherosclerotic heart disease of native coronary artery without angina pectoris: Secondary | ICD-10-CM | POA: Diagnosis not present

## 2020-02-15 DIAGNOSIS — Z9889 Other specified postprocedural states: Secondary | ICD-10-CM | POA: Diagnosis not present

## 2020-02-15 DIAGNOSIS — E7800 Pure hypercholesterolemia, unspecified: Secondary | ICD-10-CM | POA: Diagnosis not present

## 2020-02-16 IMAGING — CT CT HEAD W/O CM
3 series · 15 of 47 positions shown, 18 images · non-contrast
Comparison: None.

CLINICAL DATA: 73-year-old male with trauma to the head.

EXAM:
CT HEAD WITHOUT CONTRAST
TECHNIQUE: Contiguous axial images were obtained from the base of the skull
through the vertex without intravenous contrast.

[Series 2: head wo · axial · 0.47mm/px · z∈[-126,-1]mm · 9 of 30 slices shown, 12 images]
[im 3/30  brain]
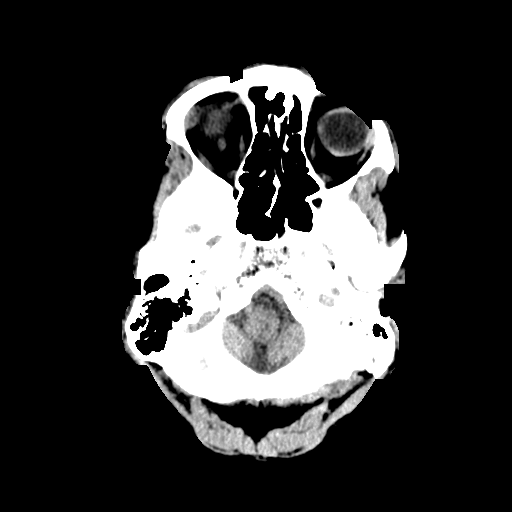
[im 3/30  bone]
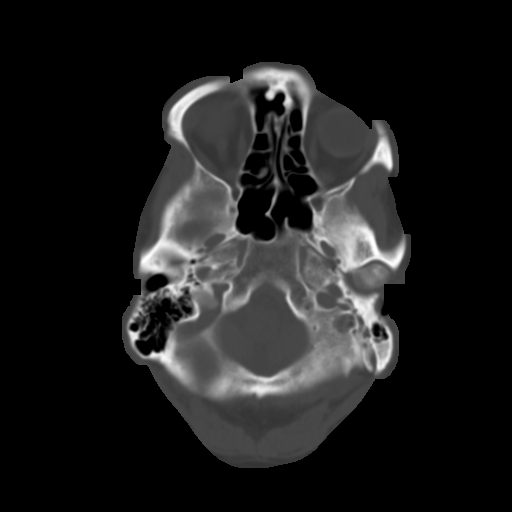
[im 6/30  brain]
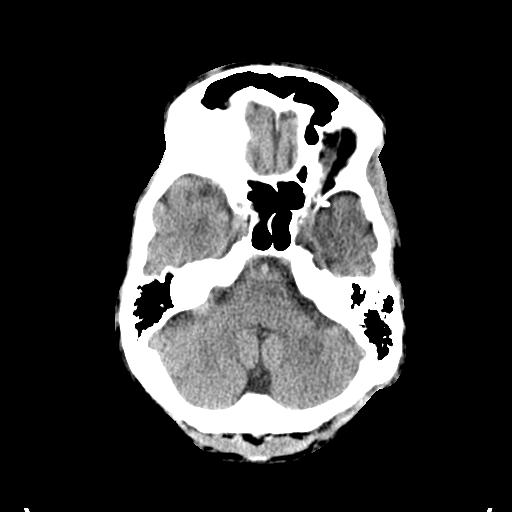
[im 9/30  brain]
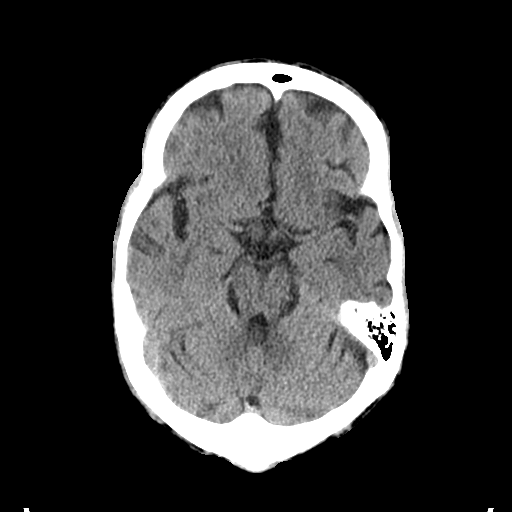
[im 12/30  brain]
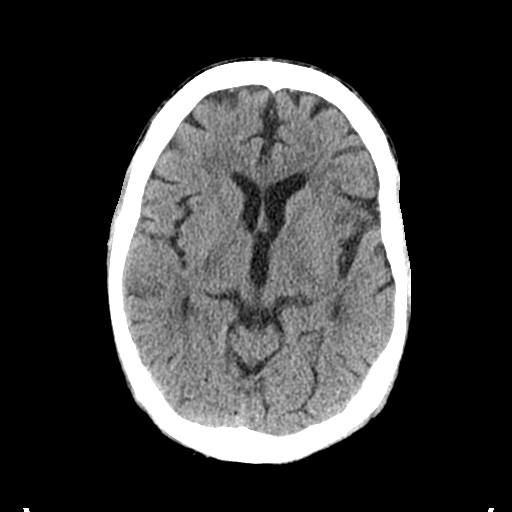
[im 16/30  brain]
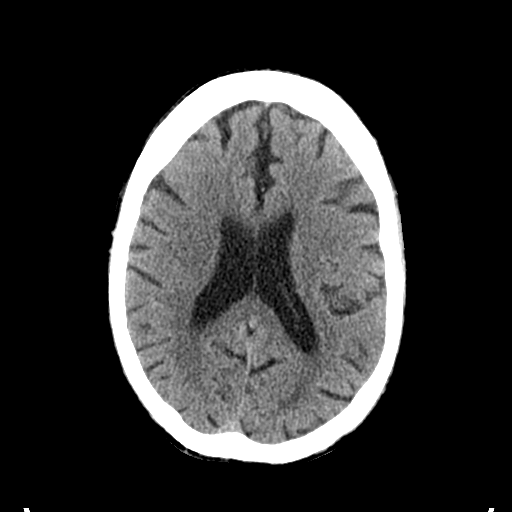
[im 16/30  bone]
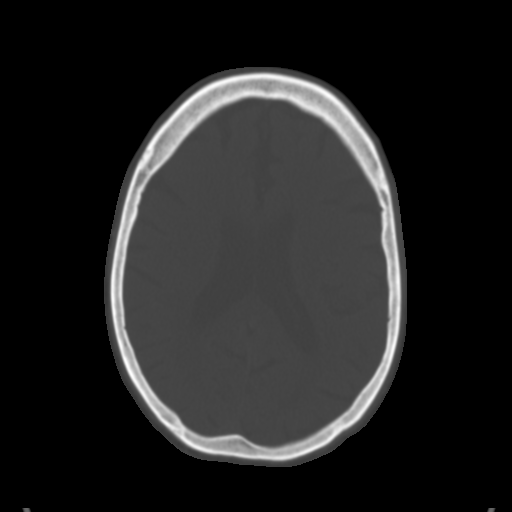
[im 19/30  brain]
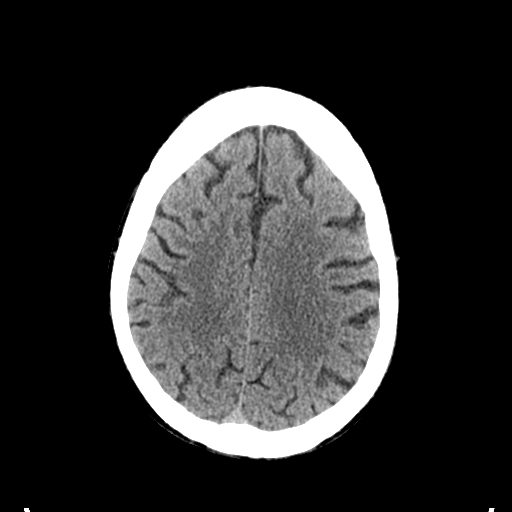
[im 22/30  brain]
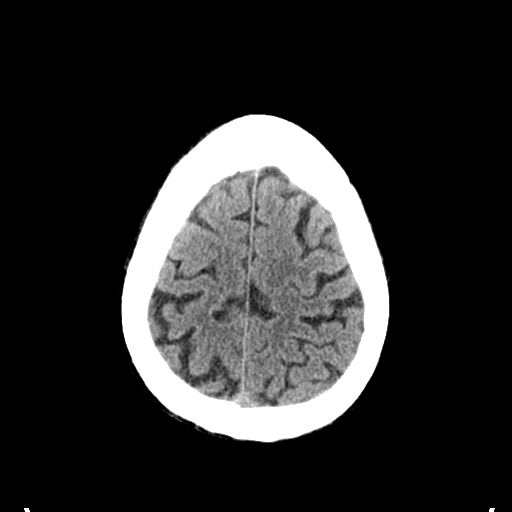
[im 25/30  brain]
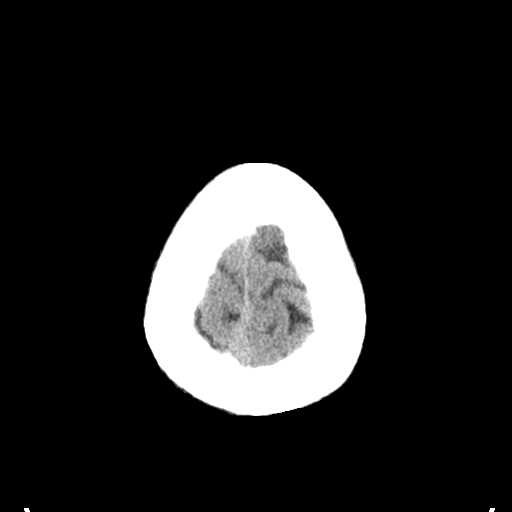
[im 28/30  brain]
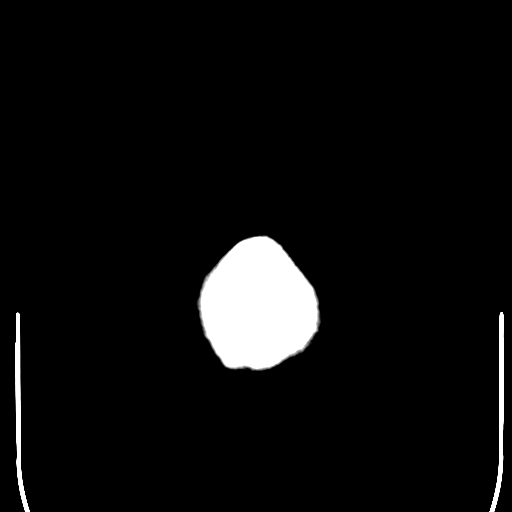
[im 28/30  bone]
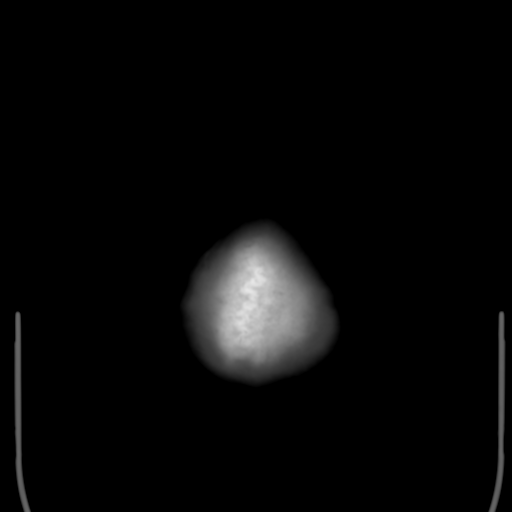

[Series 4: coronal soft tissue · coronal · 0.29mm/px · 3 of 71 slices shown]
[im 24/71  brain]
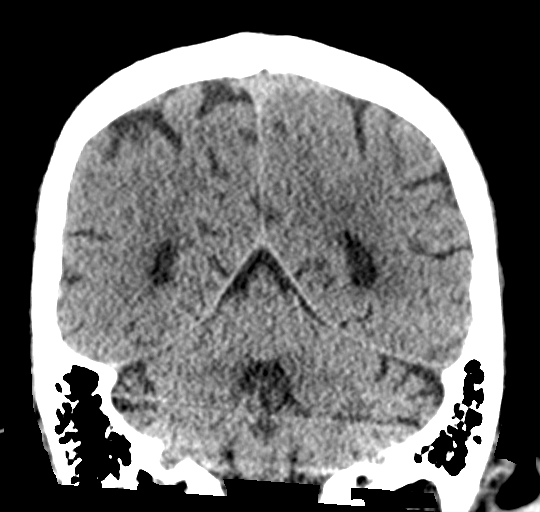
[im 32/71  brain]
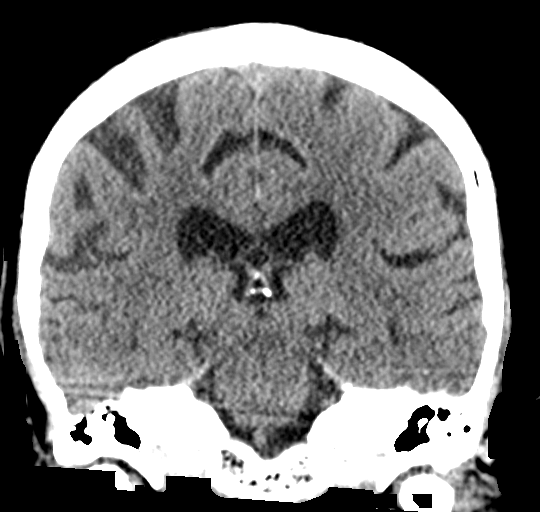
[im 39/71  brain]
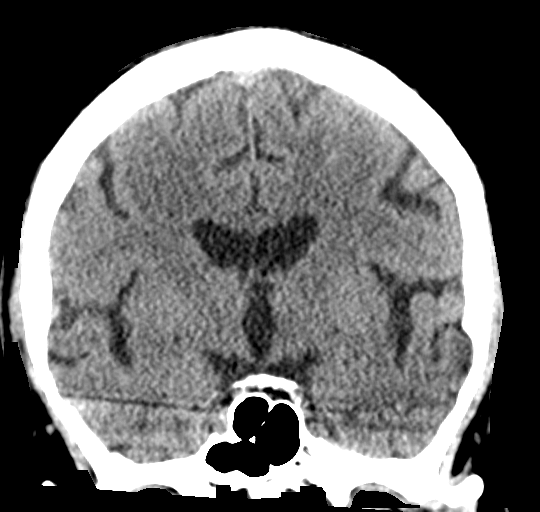

[Series 5: sagittal soft tissue · sagittal · 0.29mm/px · 3 of 53 slices shown]
[im 18/53  brain]
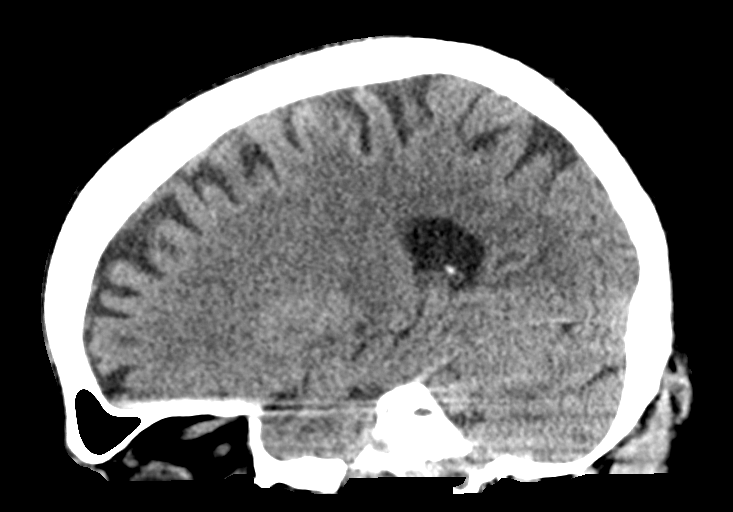
[im 27/53  brain]
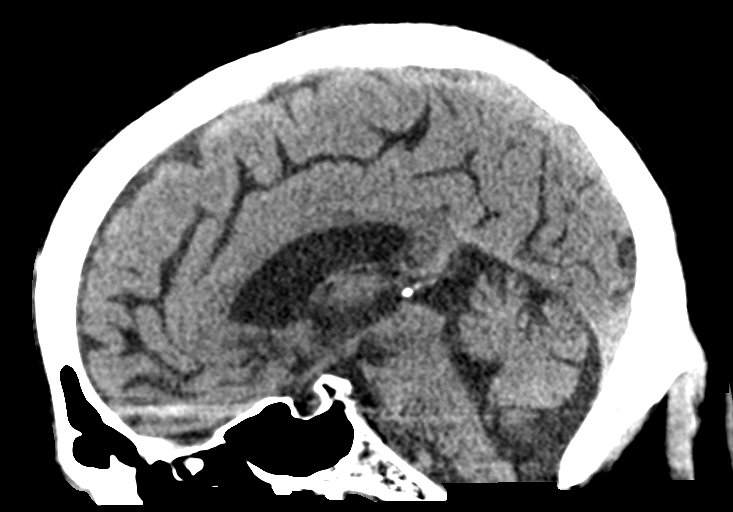
[im 35/53  brain]
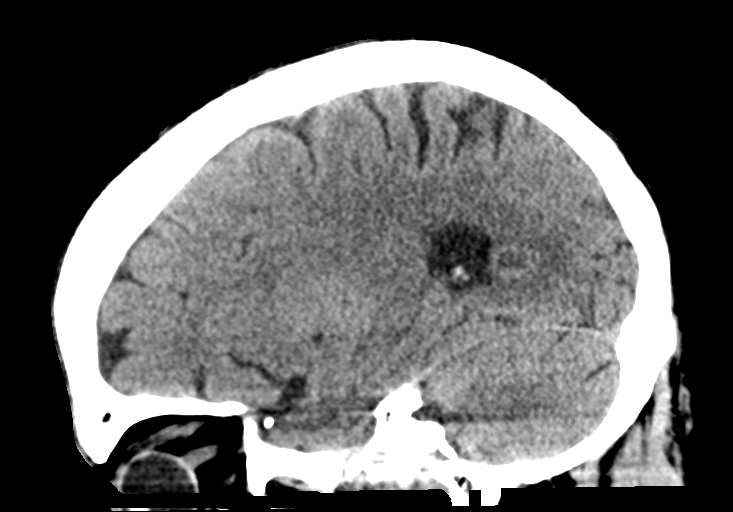

[15 of 47 positions shown; findings below may reference images not displayed]

FINDINGS: Brain: Mild age-related atrophy and chronic microvascular ischemic
changes. There is no acute intracranial hemorrhage. No mass effect
or midline shift. No extra-axial fluid collection.

Vascular: No hyperdense vessel or unexpected calcification.

Skull: Normal. Negative for fracture or focal lesion.

Sinuses/Orbits: No acute finding. Cerumen noted in the left external
auditory canal.

Other: None
IMPRESSION: No acute intracranial pathology.

## 2020-02-17 DIAGNOSIS — H903 Sensorineural hearing loss, bilateral: Secondary | ICD-10-CM | POA: Diagnosis not present

## 2020-02-17 DIAGNOSIS — H6123 Impacted cerumen, bilateral: Secondary | ICD-10-CM | POA: Diagnosis not present

## 2020-04-03 DIAGNOSIS — R350 Frequency of micturition: Secondary | ICD-10-CM | POA: Diagnosis not present

## 2020-04-03 DIAGNOSIS — I2510 Atherosclerotic heart disease of native coronary artery without angina pectoris: Secondary | ICD-10-CM | POA: Diagnosis not present

## 2020-04-03 DIAGNOSIS — R399 Unspecified symptoms and signs involving the genitourinary system: Secondary | ICD-10-CM | POA: Diagnosis not present

## 2020-04-03 DIAGNOSIS — I10 Essential (primary) hypertension: Secondary | ICD-10-CM | POA: Diagnosis not present

## 2020-04-03 DIAGNOSIS — R634 Abnormal weight loss: Secondary | ICD-10-CM | POA: Diagnosis not present

## 2020-06-20 DIAGNOSIS — H52223 Regular astigmatism, bilateral: Secondary | ICD-10-CM | POA: Diagnosis not present

## 2020-06-20 DIAGNOSIS — H5203 Hypermetropia, bilateral: Secondary | ICD-10-CM | POA: Diagnosis not present

## 2020-06-20 DIAGNOSIS — H35033 Hypertensive retinopathy, bilateral: Secondary | ICD-10-CM | POA: Diagnosis not present

## 2020-06-20 DIAGNOSIS — H2513 Age-related nuclear cataract, bilateral: Secondary | ICD-10-CM | POA: Diagnosis not present

## 2020-06-20 DIAGNOSIS — H524 Presbyopia: Secondary | ICD-10-CM | POA: Diagnosis not present

## 2020-06-25 DIAGNOSIS — Z0100 Encounter for examination of eyes and vision without abnormal findings: Secondary | ICD-10-CM | POA: Diagnosis not present

## 2020-07-30 DIAGNOSIS — I10 Essential (primary) hypertension: Secondary | ICD-10-CM | POA: Diagnosis not present

## 2020-07-30 DIAGNOSIS — E7800 Pure hypercholesterolemia, unspecified: Secondary | ICD-10-CM | POA: Diagnosis not present

## 2020-07-30 DIAGNOSIS — R7309 Other abnormal glucose: Secondary | ICD-10-CM | POA: Diagnosis not present

## 2020-08-06 DIAGNOSIS — Z125 Encounter for screening for malignant neoplasm of prostate: Secondary | ICD-10-CM | POA: Diagnosis not present

## 2020-08-06 DIAGNOSIS — R7303 Prediabetes: Secondary | ICD-10-CM | POA: Diagnosis not present

## 2020-08-06 DIAGNOSIS — Z0000 Encounter for general adult medical examination without abnormal findings: Secondary | ICD-10-CM | POA: Diagnosis not present

## 2020-08-06 DIAGNOSIS — E7800 Pure hypercholesterolemia, unspecified: Secondary | ICD-10-CM | POA: Diagnosis not present

## 2020-08-06 DIAGNOSIS — I2510 Atherosclerotic heart disease of native coronary artery without angina pectoris: Secondary | ICD-10-CM | POA: Diagnosis not present

## 2020-08-06 DIAGNOSIS — I10 Essential (primary) hypertension: Secondary | ICD-10-CM | POA: Diagnosis not present

## 2020-08-16 DIAGNOSIS — L218 Other seborrheic dermatitis: Secondary | ICD-10-CM | POA: Diagnosis not present

## 2020-08-22 DIAGNOSIS — I10 Essential (primary) hypertension: Secondary | ICD-10-CM | POA: Diagnosis not present

## 2020-08-22 DIAGNOSIS — E7800 Pure hypercholesterolemia, unspecified: Secondary | ICD-10-CM | POA: Diagnosis not present

## 2020-08-22 DIAGNOSIS — R600 Localized edema: Secondary | ICD-10-CM | POA: Diagnosis not present

## 2020-08-22 DIAGNOSIS — Z9889 Other specified postprocedural states: Secondary | ICD-10-CM | POA: Diagnosis not present

## 2020-08-22 DIAGNOSIS — I25118 Atherosclerotic heart disease of native coronary artery with other forms of angina pectoris: Secondary | ICD-10-CM | POA: Diagnosis not present

## 2020-12-04 ENCOUNTER — Telehealth: Payer: Self-pay

## 2020-12-04 NOTE — Telephone Encounter (Signed)
Patient would like to schedule his colonoscopy with Dr. Allen Norris.  I informed him that we will need a referral sent to our office since his last colonoscopy in 2017 was with Dr. Candace Cruise.  Thanks,  [place], [place]

## 2020-12-06 ENCOUNTER — Other Ambulatory Visit: Payer: Self-pay

## 2020-12-06 ENCOUNTER — Telehealth: Payer: Self-pay

## 2020-12-06 DIAGNOSIS — Z1211 Encounter for screening for malignant neoplasm of colon: Secondary | ICD-10-CM

## 2020-12-06 MED ORDER — NA SULFATE-K SULFATE-MG SULF 17.5-3.13-1.6 GM/177ML PO SOLN: 1.0000 | Freq: Once | ORAL | 0 refills | Status: AC

## 2020-12-06 NOTE — Telephone Encounter (Signed)
Gastroenterology Pre-Procedure Review  Request Date: 02/05/21 Requesting Physician: Dr. Wohl  PATIENT REVIEW QUESTIONS: The patient responded to the following health history questions as indicated:    1. Are you having any GI issues? no 2. Do you have a personal history of Polyps? no 3. Do you have a family history of Colon Cancer or Polyps? yes (mother colon cancer) 4. Diabetes Mellitus? no 5. Joint replacements in the past 12 months?no 6. Major health problems in the past 3 months?no 7. Any artificial heart valves, MVP, or defibrillator?no    MEDICATIONS & ALLERGIES:    Patient reports the following regarding taking any anticoagulation/antiplatelet therapy:   Plavix, Coumadin, Eliquis, Xarelto, Lovenox, Pradaxa, Brilinta, or Effient? no Aspirin? yes (81 mg daily)  Patient confirms/reports the following medications:  Current Outpatient Medications  Medication Sig Dispense Refill  . ALPRAZolam (XANAX) 0.25 MG tablet Take 0.25 mg by mouth at bedtime as needed for anxiety.    . amLODipine (NORVASC) 2.5 MG tablet Take 2.5 mg by mouth daily.    . aspirin 81 MG EC tablet Take by mouth.    . atenolol-chlorthalidone (TENORETIC) 50-25 MG per tablet Take 1 tablet by mouth daily. Reported on 09/07/2015    . carvedilol (COREG) 25 MG tablet Take 25 mg by mouth 2 (two) times daily.    . HYDROcodone-acetaminophen (NORCO/VICODIN) 5-325 MG per tablet Take 1 tablet by mouth every 6 (six) hours as needed for moderate pain.    . ketoconazole (NIZORAL) 2 % shampoo Apply topically.    . metoprolol succinate (TOPROL-XL) 50 MG 24 hr tablet Take 50 mg by mouth once. Take with or immediately following a meal.    . Na Sulfate-K Sulfate-Mg Sulf 17.5-3.13-1.6 GM/177ML SOLN Take 1 kit by mouth once for 1 dose. 354 mL 0  . psyllium (REGULOID) 0.52 g capsule Take by mouth.    . sertraline (ZOLOFT) 50 MG tablet Take 50 mg by mouth daily.    . simvastatin (ZOCOR) 20 MG tablet Take 20 mg by mouth daily.    .  traMADol (ULTRAM) 50 MG tablet Take 1 tablet (50 mg total) by mouth every 6 (six) hours as needed. 10 tablet 0  . traZODone (DESYREL) 50 MG tablet Take 50 mg by mouth at bedtime.     No current facility-administered medications for this visit.    Patient confirms/reports the following allergies:  Allergies  Allergen Reactions  . Sulfa Antibiotics Hives    No orders of the defined types were placed in this encounter.   AUTHORIZATION INFORMATION Primary Insurance: 1D#: Group #:  Secondary Insurance: 1D#: Group #:  SCHEDULE INFORMATION: Date: 02/05/21 Time: Location:[facility] 

## 2021-01-11 ENCOUNTER — Telehealth: Payer: Self-pay | Admitting: Gastroenterology

## 2021-01-11 NOTE — Telephone Encounter (Signed)
Needs to r/s his procedure due to vacation.

## 2021-01-14 ENCOUNTER — Other Ambulatory Visit: Payer: Self-pay

## 2021-01-14 NOTE — Telephone Encounter (Signed)
Pt has been rescheduled to 02/19/21.  

## 2021-01-28 DIAGNOSIS — E7800 Pure hypercholesterolemia, unspecified: Secondary | ICD-10-CM | POA: Diagnosis not present

## 2021-01-28 DIAGNOSIS — I10 Essential (primary) hypertension: Secondary | ICD-10-CM | POA: Diagnosis not present

## 2021-01-28 DIAGNOSIS — R7303 Prediabetes: Secondary | ICD-10-CM | POA: Diagnosis not present

## 2021-01-28 DIAGNOSIS — Z125 Encounter for screening for malignant neoplasm of prostate: Secondary | ICD-10-CM | POA: Diagnosis not present

## 2021-02-04 DIAGNOSIS — I25118 Atherosclerotic heart disease of native coronary artery with other forms of angina pectoris: Secondary | ICD-10-CM | POA: Diagnosis not present

## 2021-02-04 DIAGNOSIS — I10 Essential (primary) hypertension: Secondary | ICD-10-CM | POA: Diagnosis not present

## 2021-02-04 DIAGNOSIS — E7800 Pure hypercholesterolemia, unspecified: Secondary | ICD-10-CM | POA: Diagnosis not present

## 2021-02-04 DIAGNOSIS — R7303 Prediabetes: Secondary | ICD-10-CM | POA: Diagnosis not present

## 2021-02-05 DIAGNOSIS — D2261 Melanocytic nevi of right upper limb, including shoulder: Secondary | ICD-10-CM | POA: Diagnosis not present

## 2021-02-05 DIAGNOSIS — D2271 Melanocytic nevi of right lower limb, including hip: Secondary | ICD-10-CM | POA: Diagnosis not present

## 2021-02-05 DIAGNOSIS — D225 Melanocytic nevi of trunk: Secondary | ICD-10-CM | POA: Diagnosis not present

## 2021-02-05 DIAGNOSIS — L570 Actinic keratosis: Secondary | ICD-10-CM | POA: Diagnosis not present

## 2021-02-05 DIAGNOSIS — X32XXXA Exposure to sunlight, initial encounter: Secondary | ICD-10-CM | POA: Diagnosis not present

## 2021-02-05 DIAGNOSIS — Z85828 Personal history of other malignant neoplasm of skin: Secondary | ICD-10-CM | POA: Diagnosis not present

## 2021-02-05 DIAGNOSIS — D2262 Melanocytic nevi of left upper limb, including shoulder: Secondary | ICD-10-CM | POA: Diagnosis not present

## 2021-02-11 DIAGNOSIS — E7800 Pure hypercholesterolemia, unspecified: Secondary | ICD-10-CM | POA: Diagnosis not present

## 2021-02-11 DIAGNOSIS — I10 Essential (primary) hypertension: Secondary | ICD-10-CM | POA: Diagnosis not present

## 2021-02-11 DIAGNOSIS — I25118 Atherosclerotic heart disease of native coronary artery with other forms of angina pectoris: Secondary | ICD-10-CM | POA: Diagnosis not present

## 2021-02-19 ENCOUNTER — Encounter
Admission: RE | Disposition: A | Discharge status: Home / Self Care | Payer: Self-pay | Source: Home / Self Care | Attending: Gastroenterology

## 2021-02-19 ENCOUNTER — Ambulatory Visit: Payer: Medicare HMO | Admitting: Certified Registered"

## 2021-02-19 ENCOUNTER — Other Ambulatory Visit: Payer: Self-pay

## 2021-02-19 ENCOUNTER — Ambulatory Visit
Admission: RE | Admit: 2021-02-19 | Discharge: 2021-02-19 | Disposition: A | Discharge status: Home / Self Care | Payer: Medicare HMO | Attending: Gastroenterology | Admitting: Gastroenterology

## 2021-02-19 ENCOUNTER — Encounter: Payer: Self-pay | Admitting: Gastroenterology

## 2021-02-19 DIAGNOSIS — Z1211 Encounter for screening for malignant neoplasm of colon: Secondary | ICD-10-CM | POA: Diagnosis not present

## 2021-02-19 DIAGNOSIS — D120 Benign neoplasm of cecum: Secondary | ICD-10-CM | POA: Diagnosis not present

## 2021-02-19 DIAGNOSIS — Z800 Family history of malignant neoplasm of digestive organs: Secondary | ICD-10-CM | POA: Insufficient documentation

## 2021-02-19 DIAGNOSIS — Z882 Allergy status to sulfonamides status: Secondary | ICD-10-CM | POA: Diagnosis not present

## 2021-02-19 DIAGNOSIS — Z79899 Other long term (current) drug therapy: Secondary | ICD-10-CM | POA: Diagnosis not present

## 2021-02-19 DIAGNOSIS — D123 Benign neoplasm of transverse colon: Secondary | ICD-10-CM | POA: Diagnosis not present

## 2021-02-19 DIAGNOSIS — K5790 Diverticulosis of intestine, part unspecified, without perforation or abscess without bleeding: Secondary | ICD-10-CM | POA: Diagnosis not present

## 2021-02-19 DIAGNOSIS — K635 Polyp of colon: Secondary | ICD-10-CM

## 2021-02-19 DIAGNOSIS — D124 Benign neoplasm of descending colon: Secondary | ICD-10-CM | POA: Insufficient documentation

## 2021-02-19 DIAGNOSIS — K5730 Diverticulosis of large intestine without perforation or abscess without bleeding: Secondary | ICD-10-CM | POA: Diagnosis not present

## 2021-02-19 DIAGNOSIS — Z7982 Long term (current) use of aspirin: Secondary | ICD-10-CM | POA: Insufficient documentation

## 2021-02-19 DIAGNOSIS — K641 Second degree hemorrhoids: Secondary | ICD-10-CM | POA: Diagnosis not present

## 2021-02-19 HISTORY — PX: COLONOSCOPY WITH PROPOFOL: SHX5780

## 2021-02-19 SURGERY — COLONOSCOPY WITH PROPOFOL
Anesthesia: General

## 2021-02-19 MED ORDER — SODIUM CHLORIDE 0.9 % IV SOLN
INTRAVENOUS | Status: DC
Start: 2021-02-19 — End: 2021-02-19

## 2021-02-19 MED ORDER — PROPOFOL 500 MG/50ML IV EMUL
INTRAVENOUS | Status: DC | PRN
  Administered 2021-02-19: 150 ug/kg/min via INTRAVENOUS

## 2021-02-19 MED ORDER — PROPOFOL 10 MG/ML IV BOLUS
INTRAVENOUS | Status: DC | PRN
  Administered 2021-02-19: 80 mg via INTRAVENOUS

## 2021-02-19 NOTE — Transfer of Care (Signed)
Immediate Anesthesia Transfer of Care Note  Patient: Ronnie Hanson  Procedure(s) Performed: COLONOSCOPY WITH PROPOFOL  Patient Location: PACU and Endoscopy Unit  Anesthesia Type:General  Level of Consciousness: drowsy  Airway & Oxygen Therapy: Patient Spontanous Breathing  Post-op Assessment: Report given to RN  Post vital signs: stable  Last Vitals:  Vitals Value Taken Time  BP    Temp    Pulse    Resp    SpO2      Last Pain:  Vitals:   02/19/21 0918  TempSrc: Temporal  PainSc: 0-No pain         Complications: No notable events documented.

## 2021-02-19 NOTE — Op Note (Signed)
[facility] Gastroenterology Patient Name: Ronnie Hanson Procedure Date: 02/19/2021 9:30 AM MRN: [id number] Account #: [id number] Date of Birth: [birth year] Admit Type: Outpatient Age: 75 Room: [facility] ENDO ROOM 2 Gender: Male Note Status: Finalized Procedure:             Colonoscopy Indications:           Screening in patient at increased risk: Family history                         of 1st-degree relative with colorectal cancer before                         age 40 years Providers:             Lucilla Lame MD, MD Referring MD:          Ocie Cornfield. Ouida Sills MD, MD (Referring MD) Medicines:             Propofol per Anesthesia Complications:         No immediate complications. Procedure:             Pre-Anesthesia Assessment:                        - Prior to the procedure, a History and Physical was                         performed, and patient medications and allergies were                         reviewed. The patient's tolerance of previous                         anesthesia was also reviewed. The risks and benefits                         of the procedure and the sedation options and risks                         were discussed with the patient. All questions were                         answered, and informed consent was obtained. Prior                         Anticoagulants: The patient has taken no previous                         anticoagulant or antiplatelet agents. ASA Grade                         Assessment: II - A patient with mild systemic disease.                         After reviewing the risks and benefits, the patient                         was deemed in satisfactory condition to undergo the  procedure.                        After obtaining informed consent, the colonoscope was                         passed under direct vision. Throughout the procedure,                         the patient's blood pressure, pulse, and  oxygen                         saturations were monitored continuously. The                         Colonoscope was introduced through the anus and                         advanced to the the cecum, identified by appendiceal                         orifice and ileocecal valve. The colonoscopy was                         performed without difficulty. The patient tolerated                         the procedure well. The quality of the bowel                         preparation was excellent. Findings:      The perianal and digital rectal examinations were normal.      A 3 mm polyp was found in the transverse colon. The polyp was sessile.       The polyp was removed with a cold biopsy forceps. Resection and       retrieval were complete.      A 6 mm polyp was found in the cecum. The polyp was sessile. The polyp       was removed with a cold snare. Resection and retrieval were complete.      A 3 mm polyp was found in the descending colon. The polyp was sessile.       The polyp was removed with a cold snare. Resection and retrieval were       complete.      Multiple small-mouthed diverticula were found in the sigmoid colon and       descending colon.      Non-bleeding internal hemorrhoids were found during retroflexion. The       hemorrhoids were Grade II (internal hemorrhoids that prolapse but reduce       spontaneously). Impression:            - One 3 mm polyp in the transverse colon, removed with                         a cold biopsy forceps. Resected and retrieved.                        - One 6 mm polyp in the cecum, removed with a cold  snare. Resected and retrieved.                        - One 3 mm polyp in the descending colon, removed with                         a cold snare. Resected and retrieved.                        - Diverticulosis in the sigmoid colon and in the                         descending colon.                        - Non-bleeding internal  hemorrhoids. Recommendation:        - Discharge patient to home.                        - Resume previous diet.                        - Continue present medications.                        - Await pathology results.                        - Repeat colonoscopy in 5 years for surveillance. Procedure Code(s):     --- Professional ---                        [phone], Colonoscopy, flexible; with removal of                         tumor(s), polyp(s), or other lesion(s) by snare                         technique                        [id number], 40, Colonoscopy, flexible; with biopsy, single                         or multiple Diagnosis Code(s):     --- Professional ---                        Z80.0, Family history of malignant neoplasm of                         digestive organs                        K63.5, Polyp of colon CPT copyright 2019 American Medical Association. All rights reserved. The codes documented in this report are preliminary and upon coder review may  be revised to meet current compliance requirements. Lucilla Lame MD, MD 02/19/2021 10:15:46 AM This report has been signed electronically. Number of Addenda: 0 Note Initiated On: 02/19/2021 9:30 AM Scope Withdrawal Time: 0 hours 6 minutes 37 seconds  Total Procedure Duration: 0 hours 13 minutes 10 seconds  Estimated Blood Loss:  Estimated blood  loss: none.      [facility]

## 2021-02-19 NOTE — Anesthesia Preprocedure Evaluation (Signed)
Anesthesia Evaluation  Patient identified by MRN, date of birth, ID band Patient awake    Reviewed: Allergy & Precautions, H&P , NPO status , Patient's Chart, lab work & pertinent test results, reviewed documented beta blocker date and time   History of Anesthesia Complications Negative for: history of anesthetic complications  Airway Mallampati: II  TM Distance: >3 FB Neck ROM: full    Dental no notable dental hx.    Pulmonary neg pulmonary ROS,    Pulmonary exam normal breath sounds clear to auscultation       Cardiovascular Exercise Tolerance: Good hypertension, On Medications + angina (stable) + CAD  (-) Past MI and (-) Cardiac Stents (-) dysrhythmias + Valvular Problems/Murmurs  Rhythm:regular Rate:Normal  Previous cath reveals 2 vessel nonsurgical disease   Neuro/Psych  Headaches, negative neurological ROS  negative psych ROS   GI/Hepatic negative GI ROS, Neg liver ROS,   Endo/Other  negative endocrine ROS  Renal/GU negative Renal ROS  negative genitourinary   Musculoskeletal   Abdominal   Peds  Hematology negative hematology ROS (+)   Anesthesia Other Findings Past Medical History: No date: Anxiety No date: Colonic polyp No date: Heart murmur No date: Hypercholesteremia No date: Hypertension No date: Migraine   Reproductive/Obstetrics negative OB ROS                             Anesthesia Physical  Anesthesia Plan  ASA: 2  Anesthesia Plan: General   Post-op Pain Management:    Induction: Intravenous  PONV Risk Score and Plan: 2 and TIVA and Propofol infusion  Airway Management Planned: Natural Airway and Nasal Cannula  Additional Equipment:   Intra-op Plan:   Post-operative Plan:   Informed Consent: I have reviewed the patients History and Physical, chart, labs and discussed the procedure including the risks, benefits and alternatives for the proposed  anesthesia with the patient or authorized representative who has indicated his/her understanding and acceptance.     Dental Advisory Given  Plan Discussed with: CRNA  Anesthesia Plan Comments:         Anesthesia Quick Evaluation

## 2021-02-19 NOTE — Anesthesia Postprocedure Evaluation (Signed)
Anesthesia Post Note  Patient: Ronnie Hanson  Procedure(s) Performed: COLONOSCOPY WITH PROPOFOL  Patient location during evaluation: Endoscopy Anesthesia Type: General Level of consciousness: awake and alert Pain management: pain level controlled Vital Signs Assessment: post-procedure vital signs reviewed and stable Respiratory status: spontaneous breathing, nonlabored ventilation, respiratory function stable and patient connected to nasal cannula oxygen Cardiovascular status: blood pressure returned to baseline and stable Postop Assessment: no apparent nausea or vomiting Anesthetic complications: no   No notable events documented.   Last Vitals:  Vitals:   02/19/21 1020 02/19/21 1030  BP: 101/66 118/76  Pulse: 61 65  Resp: 13 14  Temp:    SpO2: 99% 98%    Last Pain:  Vitals:   02/19/21 1010  TempSrc: Temporal  PainSc:                  Martha Clan

## 2021-02-19 NOTE — H&P (Signed)
Lucilla Lame, MD [address] [place], [place] [address] Phone: [phone] Fax : [phone]  Primary Care Physician:  Kirk Ruths, MD Primary Gastroenterologist:  Dr. Allen Norris  Pre-Procedure History & Physical: HPI:  Ronnie Hanson is a 75 y.o. male is here for a screening colonoscopy.   Past Medical History:  Diagnosis Date   Anxiety    Colonic polyp    Heart murmur    Hypercholesteremia    Hypertension    Migraine     Past Surgical History:  Procedure Laterality Date   CARDIAC CATHETERIZATION N/A 03/13/2015   Procedure: Left Heart Cath;  Surgeon: Isaias Cowman, MD;  Location: [place] [facility];  Service: Cardiovascular;  Laterality: N/A;   COLONOSCOPY N/A 09/07/2015   Procedure: COLONOSCOPY;  Surgeon: Hulen Luster, MD;  Location: [facility] ENDOSCOPY;  Service: Gastroenterology;  Laterality: N/A;   TONSILLECTOMY      Prior to Admission medications   Medication Sig Start Date End Date Taking? Authorizing Provider  amLODipine (NORVASC) 2.5 MG tablet Take 2.5 mg by mouth daily. 08/30/20  Yes [provider]  aspirin 81 MG EC tablet Take by mouth.   Yes [provider]  atenolol-chlorthalidone (TENORETIC) 50-25 MG per tablet Take 1 tablet by mouth daily. Reported on 09/07/2015   Yes [provider]  carvedilol (COREG) 25 MG tablet Take 25 mg by mouth 2 (two) times daily. 11/07/20  Yes [provider]  sertraline (ZOLOFT) 50 MG tablet Take 50 mg by mouth daily.   Yes [provider]  simvastatin (ZOCOR) 20 MG tablet Take 20 mg by mouth daily.   Yes [provider]  [place] KIT 17.5-3.13-1.6 GM/177ML SOLN  12/06/20  Yes [provider]  traZODone (DESYREL) 50 MG tablet Take 50 mg by mouth at bedtime. 11/19/20  Yes [provider]  ALPRAZolam Duanne Moron) 0.25 MG tablet Take 0.25 mg by mouth at bedtime as needed for anxiety.    [provider]   HYDROcodone-acetaminophen (NORCO/VICODIN) 5-325 MG per tablet Take 1 tablet by mouth every 6 (six) hours as needed for moderate pain.    [provider]  ketoconazole (NIZORAL) 2 % shampoo Apply topically. 08/17/20   [provider]  metoprolol succinate (TOPROL-XL) 50 MG 24 hr tablet Take 50 mg by mouth once. Take with or immediately following a meal. Patient not taking: Reported on 02/19/2021    [provider]  psyllium (REGULOID) 0.52 g capsule Take by mouth.    [provider]  traMADol (ULTRAM) 50 MG tablet Take 1 tablet (50 mg total) by mouth every 6 (six) hours as needed. Patient not taking: Reported on 02/19/2021 08/21/19   Betha Loa D, PA-C    Allergies as of 12/06/2020 - Review Complete 08/21/2019  Allergen Reaction Noted   Sulfa antibiotics Hives 12/03/2013    History reviewed. No pertinent family history.  Social History   Socioeconomic History   Marital status: Married    Spouse name: Not on file   Number of children: Not on file   Years of education: Not on file   Highest education level: Not on file  Occupational History   Not on file  Tobacco Use   Smoking status: Never   Smokeless tobacco: Never  Substance and Sexual Activity   Alcohol use: No   Drug use: No   Sexual activity: Not on file  Other Topics Concern   Not on file  Social History Narrative   Not  on file   Social Determinants of Health   Financial Resource Strain: Not on file  Food Insecurity: Not on file  Transportation Needs: Not on file  Physical Activity: Not on file  Stress: Not on file  Social Connections: Not on file  Intimate Partner Violence: Not on file    Review of Systems: See HPI, otherwise negative ROS  Physical Exam: BP (!) 142/84   Pulse 60   Temp 97.9 F (36.6 C) (Temporal)   Resp 18   Ht 5' 7"  (1.702 m)   Wt 63.5 kg   SpO2 99%   BMI 21.93 kg/m  General:   Alert,  pleasant and cooperative in NAD Head:   Normocephalic and atraumatic. Neck:  Supple; no masses or thyromegaly. Lungs:  Clear throughout to auscultation.    Heart:  Regular rate and rhythm. Abdomen:  Soft, nontender and nondistended. Normal bowel sounds, without guarding, and without rebound.   Neurologic:  Alert and  oriented x4;  grossly normal neurologically.  Impression/Plan: Ronnie Hanson is now here to undergo a screening colonoscopy.  Risks, benefits, and alternatives regarding colonoscopy have been reviewed with the patient.  Questions have been answered.  All parties agreeable.

## 2021-02-20 ENCOUNTER — Encounter: Payer: Self-pay | Admitting: Gastroenterology

## 2021-02-20 LAB — SURGICAL PATHOLOGY

## 2021-02-21 ENCOUNTER — Encounter: Payer: Self-pay | Admitting: Gastroenterology

## 2021-03-07 DIAGNOSIS — H43811 Vitreous degeneration, right eye: Secondary | ICD-10-CM | POA: Diagnosis not present

## 2021-04-03 DIAGNOSIS — H43813 Vitreous degeneration, bilateral: Secondary | ICD-10-CM | POA: Diagnosis not present

## 2021-04-03 DIAGNOSIS — H2513 Age-related nuclear cataract, bilateral: Secondary | ICD-10-CM | POA: Diagnosis not present

## 2021-04-03 DIAGNOSIS — H52223 Regular astigmatism, bilateral: Secondary | ICD-10-CM | POA: Diagnosis not present

## 2021-04-03 DIAGNOSIS — H5203 Hypermetropia, bilateral: Secondary | ICD-10-CM | POA: Diagnosis not present

## 2021-04-03 DIAGNOSIS — H524 Presbyopia: Secondary | ICD-10-CM | POA: Diagnosis not present

## 2021-04-03 DIAGNOSIS — H35033 Hypertensive retinopathy, bilateral: Secondary | ICD-10-CM | POA: Diagnosis not present

## 2021-07-31 DIAGNOSIS — I25118 Atherosclerotic heart disease of native coronary artery with other forms of angina pectoris: Secondary | ICD-10-CM | POA: Diagnosis not present

## 2021-07-31 DIAGNOSIS — I10 Essential (primary) hypertension: Secondary | ICD-10-CM | POA: Diagnosis not present

## 2021-07-31 DIAGNOSIS — R7303 Prediabetes: Secondary | ICD-10-CM | POA: Diagnosis not present

## 2021-08-07 DIAGNOSIS — I25118 Atherosclerotic heart disease of native coronary artery with other forms of angina pectoris: Secondary | ICD-10-CM | POA: Diagnosis not present

## 2021-08-07 DIAGNOSIS — I10 Essential (primary) hypertension: Secondary | ICD-10-CM | POA: Diagnosis not present

## 2021-08-07 DIAGNOSIS — Z0000 Encounter for general adult medical examination without abnormal findings: Secondary | ICD-10-CM | POA: Diagnosis not present

## 2021-08-07 DIAGNOSIS — R7303 Prediabetes: Secondary | ICD-10-CM | POA: Diagnosis not present

## 2021-08-07 DIAGNOSIS — E7800 Pure hypercholesterolemia, unspecified: Secondary | ICD-10-CM | POA: Diagnosis not present

## 2021-08-08 DIAGNOSIS — I10 Essential (primary) hypertension: Secondary | ICD-10-CM | POA: Diagnosis not present

## 2021-08-08 DIAGNOSIS — I25118 Atherosclerotic heart disease of native coronary artery with other forms of angina pectoris: Secondary | ICD-10-CM | POA: Diagnosis not present

## 2021-08-08 DIAGNOSIS — Z23 Encounter for immunization: Secondary | ICD-10-CM | POA: Diagnosis not present

## 2021-08-08 DIAGNOSIS — E7800 Pure hypercholesterolemia, unspecified: Secondary | ICD-10-CM | POA: Diagnosis not present

## 2021-08-08 DIAGNOSIS — Z9889 Other specified postprocedural states: Secondary | ICD-10-CM | POA: Diagnosis not present

## 2022-01-29 DIAGNOSIS — I10 Essential (primary) hypertension: Secondary | ICD-10-CM | POA: Diagnosis not present

## 2022-01-29 DIAGNOSIS — R7303 Prediabetes: Secondary | ICD-10-CM | POA: Diagnosis not present

## 2022-01-29 DIAGNOSIS — I25118 Atherosclerotic heart disease of native coronary artery with other forms of angina pectoris: Secondary | ICD-10-CM | POA: Diagnosis not present

## 2022-01-29 DIAGNOSIS — E7800 Pure hypercholesterolemia, unspecified: Secondary | ICD-10-CM | POA: Diagnosis not present

## 2022-01-29 DIAGNOSIS — Z125 Encounter for screening for malignant neoplasm of prostate: Secondary | ICD-10-CM | POA: Diagnosis not present

## 2022-02-04 DIAGNOSIS — I25118 Atherosclerotic heart disease of native coronary artery with other forms of angina pectoris: Secondary | ICD-10-CM | POA: Diagnosis not present

## 2022-02-04 DIAGNOSIS — I10 Essential (primary) hypertension: Secondary | ICD-10-CM | POA: Diagnosis not present

## 2022-02-04 DIAGNOSIS — E7800 Pure hypercholesterolemia, unspecified: Secondary | ICD-10-CM | POA: Diagnosis not present

## 2022-02-04 DIAGNOSIS — H5203 Hypermetropia, bilateral: Secondary | ICD-10-CM | POA: Diagnosis not present

## 2022-02-04 DIAGNOSIS — H43813 Vitreous degeneration, bilateral: Secondary | ICD-10-CM | POA: Diagnosis not present

## 2022-02-04 DIAGNOSIS — H524 Presbyopia: Secondary | ICD-10-CM | POA: Diagnosis not present

## 2022-02-04 DIAGNOSIS — H2513 Age-related nuclear cataract, bilateral: Secondary | ICD-10-CM | POA: Diagnosis not present

## 2022-02-04 DIAGNOSIS — G43109 Migraine with aura, not intractable, without status migrainosus: Secondary | ICD-10-CM | POA: Diagnosis not present

## 2022-02-04 DIAGNOSIS — Z9889 Other specified postprocedural states: Secondary | ICD-10-CM | POA: Diagnosis not present

## 2022-02-04 DIAGNOSIS — H52223 Regular astigmatism, bilateral: Secondary | ICD-10-CM | POA: Diagnosis not present

## 2022-02-04 DIAGNOSIS — R079 Chest pain, unspecified: Secondary | ICD-10-CM | POA: Diagnosis not present

## 2022-02-04 DIAGNOSIS — H35033 Hypertensive retinopathy, bilateral: Secondary | ICD-10-CM | POA: Diagnosis not present

## 2022-02-05 DIAGNOSIS — R7303 Prediabetes: Secondary | ICD-10-CM | POA: Diagnosis not present

## 2022-02-05 DIAGNOSIS — I25118 Atherosclerotic heart disease of native coronary artery with other forms of angina pectoris: Secondary | ICD-10-CM | POA: Diagnosis not present

## 2022-02-05 DIAGNOSIS — I10 Essential (primary) hypertension: Secondary | ICD-10-CM | POA: Diagnosis not present

## 2022-02-06 DIAGNOSIS — L821 Other seborrheic keratosis: Secondary | ICD-10-CM | POA: Diagnosis not present

## 2022-02-06 DIAGNOSIS — L570 Actinic keratosis: Secondary | ICD-10-CM | POA: Diagnosis not present

## 2022-02-06 DIAGNOSIS — D2261 Melanocytic nevi of right upper limb, including shoulder: Secondary | ICD-10-CM | POA: Diagnosis not present

## 2022-02-06 DIAGNOSIS — D2262 Melanocytic nevi of left upper limb, including shoulder: Secondary | ICD-10-CM | POA: Diagnosis not present

## 2022-02-06 DIAGNOSIS — D2272 Melanocytic nevi of left lower limb, including hip: Secondary | ICD-10-CM | POA: Diagnosis not present

## 2022-02-06 DIAGNOSIS — D485 Neoplasm of uncertain behavior of skin: Secondary | ICD-10-CM | POA: Diagnosis not present

## 2022-02-06 DIAGNOSIS — B078 Other viral warts: Secondary | ICD-10-CM | POA: Diagnosis not present

## 2022-02-06 DIAGNOSIS — Z85828 Personal history of other malignant neoplasm of skin: Secondary | ICD-10-CM | POA: Diagnosis not present

## 2022-02-21 DIAGNOSIS — Z9889 Other specified postprocedural states: Secondary | ICD-10-CM | POA: Diagnosis not present

## 2022-02-21 DIAGNOSIS — I25118 Atherosclerotic heart disease of native coronary artery with other forms of angina pectoris: Secondary | ICD-10-CM | POA: Diagnosis not present

## 2022-02-27 DIAGNOSIS — I25118 Atherosclerotic heart disease of native coronary artery with other forms of angina pectoris: Secondary | ICD-10-CM | POA: Diagnosis not present

## 2022-02-27 DIAGNOSIS — E7800 Pure hypercholesterolemia, unspecified: Secondary | ICD-10-CM | POA: Diagnosis not present

## 2022-02-27 DIAGNOSIS — I10 Essential (primary) hypertension: Secondary | ICD-10-CM | POA: Diagnosis not present

## 2022-04-21 DIAGNOSIS — H52223 Regular astigmatism, bilateral: Secondary | ICD-10-CM | POA: Diagnosis not present

## 2022-04-21 DIAGNOSIS — G43109 Migraine with aura, not intractable, without status migrainosus: Secondary | ICD-10-CM | POA: Diagnosis not present

## 2022-04-21 DIAGNOSIS — H2513 Age-related nuclear cataract, bilateral: Secondary | ICD-10-CM | POA: Diagnosis not present

## 2022-04-21 DIAGNOSIS — H35033 Hypertensive retinopathy, bilateral: Secondary | ICD-10-CM | POA: Diagnosis not present

## 2022-04-21 DIAGNOSIS — H524 Presbyopia: Secondary | ICD-10-CM | POA: Diagnosis not present

## 2022-04-21 DIAGNOSIS — H43813 Vitreous degeneration, bilateral: Secondary | ICD-10-CM | POA: Diagnosis not present

## 2022-04-21 DIAGNOSIS — H5203 Hypermetropia, bilateral: Secondary | ICD-10-CM | POA: Diagnosis not present

## 2022-08-04 DIAGNOSIS — I25118 Atherosclerotic heart disease of native coronary artery with other forms of angina pectoris: Secondary | ICD-10-CM | POA: Diagnosis not present

## 2022-08-04 DIAGNOSIS — R7303 Prediabetes: Secondary | ICD-10-CM | POA: Diagnosis not present

## 2022-08-04 DIAGNOSIS — I10 Essential (primary) hypertension: Secondary | ICD-10-CM | POA: Diagnosis not present

## 2022-08-11 DIAGNOSIS — R7303 Prediabetes: Secondary | ICD-10-CM | POA: Diagnosis not present

## 2022-08-11 DIAGNOSIS — I25118 Atherosclerotic heart disease of native coronary artery with other forms of angina pectoris: Secondary | ICD-10-CM | POA: Diagnosis not present

## 2022-08-11 DIAGNOSIS — E7800 Pure hypercholesterolemia, unspecified: Secondary | ICD-10-CM | POA: Diagnosis not present

## 2022-08-11 DIAGNOSIS — I10 Essential (primary) hypertension: Secondary | ICD-10-CM | POA: Diagnosis not present

## 2022-08-11 DIAGNOSIS — Z0000 Encounter for general adult medical examination without abnormal findings: Secondary | ICD-10-CM | POA: Diagnosis not present

## 2022-09-03 DIAGNOSIS — E7800 Pure hypercholesterolemia, unspecified: Secondary | ICD-10-CM | POA: Diagnosis not present

## 2022-09-03 DIAGNOSIS — I25118 Atherosclerotic heart disease of native coronary artery with other forms of angina pectoris: Secondary | ICD-10-CM | POA: Diagnosis not present

## 2022-09-03 DIAGNOSIS — I10 Essential (primary) hypertension: Secondary | ICD-10-CM | POA: Diagnosis not present

## 2022-09-03 DIAGNOSIS — Z9889 Other specified postprocedural states: Secondary | ICD-10-CM | POA: Diagnosis not present

## 2022-10-13 DIAGNOSIS — K4090 Unilateral inguinal hernia, without obstruction or gangrene, not specified as recurrent: Secondary | ICD-10-CM | POA: Diagnosis not present

## 2022-10-16 DIAGNOSIS — K4090 Unilateral inguinal hernia, without obstruction or gangrene, not specified as recurrent: Secondary | ICD-10-CM | POA: Diagnosis not present

## 2022-10-17 ENCOUNTER — Ambulatory Visit: Payer: Self-pay | Admitting: General Surgery

## 2022-10-17 NOTE — H&P (Signed)
PATIENT PROFILE: Ronnie Hanson is a 77 y.o. male who presents to the Clinic for consultation at the request of [place], [place] for evaluation of right inguinal hernia.  PCP:  Ronnie Donath, MD  HISTORY OF PRESENT ILLNESS: Ronnie Hanson reports feeling pain on the his right groin for the last couple of weeks.  He endorses that the pain is described as burning and pressure.  This is aggravated by physical activity.  He cannot identify any alleviating factor.  He does feel a bulge in the right groin that grows and is able to reduce.  Denies any episode of abdominal distention, nausea or vomiting.  Denies any previous groin surgery.   PROBLEM LIST: Problem List  Date Reviewed: 02/27/2022          Noted   Prediabetes 01/21/2018   Healthcare maintenance 01/20/2017   Overview    MEDICARE WELLNESS VISIT    PROVIDERS RENDERING CARE Dr. Ouida Sills, Optho in [facility], Dr Josefa Half   FUNCTIONAL ASSESSMENT  (1) Hearing: Demonstrates normal hearing in conversation.  (2) Risk of Falls: No reports of falls or abnormal balance. Gait is observed to be good upon observation.  (3) Home Safety; Home is safe and secure (4) Activities of Daily Living; Household chores and grooming are managed without problems. Personal finances are managed without problems.    DEPRESSION SCREENING There does not seem to be loss of interest in activities nor excess crying or changes in sleep or appetite.    COGNITIVE SCREENING Orientation is appropriate as are responses to questions and general conversation. No reports of forgetfulness or losing things.      PREVENTION PLAN Cardiovascular; On a statin Diabetes; Yearly glucose needed Colon Cancer; 1-17 colonoscopy and 6-22 with polyps and due in 5 years per patient Glaucoma; Yearly in [place] Pneumonia; pneumovax 2013 and prevnar 13 in 2018 Shingles; Zostavax 2011, Shingrix 5-19 Influenza; Yearly dpt in 3-04 and TDAP 2014 Smoking Cessation: NA     OTHER PERSONALIZED HEALTH ADVISE Walking during the day, low fat diet.    END OF LIFE CARE WANTS Full code, no long term support. Has a living will.          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 Normal LVF with LV ejection fraction of 68%.      Migraine 12/04/2013   Hypercholesteremia 12/04/2013   Essential hypertension, benign 12/04/2013   Anxiety 12/04/2013   Overview    secondary to stress at work       GENERAL REVIEW OF SYSTEMS:   General ROS: negative for - chills, fatigue, fever, weight gain or weight loss Allergy and Immunology ROS: negative for - hives  Hematological and Lymphatic ROS: negative for - bleeding problems or bruising, negative for palpable nodes Endocrine ROS: negative for - heat or cold intolerance, hair changes Respiratory ROS: negative for - cough, shortness of breath or wheezing Cardiovascular ROS: no chest pain or palpitations GI ROS: negative for nausea, vomiting, abdominal pain, diarrhea, constipation Musculoskeletal ROS: negative for - joint swelling or muscle pain Neurological ROS: negative for - confusion, syncope Dermatological ROS: negative for pruritus and rash Psychiatric: negative for anxiety, depression, difficulty sleeping and memory loss  MEDICATIONS: Current Outpatient Medications  Medication Sig Dispense Refill  amLODIPine (NORVASC) 2.5 MG tablet Take 1 tablet (2.5 mg total) by mouth once daily 90 tablet 1   aspirin 81 MG EC tablet Take 81 mg by mouth once daily.     carvediloL (COREG) 25 MG tablet TAKE 1 TABLET TWICE DAILY WITH MEALS 180 tablet 3   multivit-min/FA/lycopen/lutein (CENTRUM SILVER MEN ORAL) Take by mouth     psyllium (METAMUCIL) 0.52 gram capsule Take 0.52 g by mouth once daily     sertraline (ZOLOFT)  50 MG tablet Take 1 tablet (50 mg total) by mouth once daily 90 tablet 3   simvastatin (ZOCOR) 20 MG tablet Take 1 tablet (20 mg total) by mouth once daily 90 tablet 1   traZODone (DESYREL) 50 MG tablet Take 1 tablet (50 mg total) by mouth at bedtime 90 tablet 1   No current facility-administered medications for this visit.    ALLERGIES: Sulfa (sulfonamide antibiotics)  PAST MEDICAL HISTORY: Past Medical History:  Diagnosis Date   Anxiety    secondary to stress at work   Colonic polyp    Coronary artery disease    Diverticulosis 07/12/2010   Heart murmur    Hypercholesteremia    Hyperplastic colon polyp 04/27/1998   Hypertension    Migraine    Tubular adenoma of colon 04/27/1998    PAST SURGICAL HISTORY: Past Surgical History:  Procedure Laterality Date   COLONOSCOPY  07/12/2010   05/30/2005, 06/12/2000, 04/27/1998   COLONOSCOPY  09/07/2015   Tubular adenoma of colon/Repeat 51yr/PYO   APPENDECTOMY     TONSILLECTOMY       FAMILY HISTORY: Family History  Problem Relation Age of Onset   Colon cancer Mother    Alzheimer's disease Mother    Ovarian cancer Mother    Parkinsonism Father    High blood pressure (Hypertension) Father      SOCIAL HISTORY: Social History   Socioeconomic History   Marital status: Married  Tobacco Use   Smoking status: Never   Smokeless tobacco: Never  Vaping Use   Vaping Use: Never used  Substance and Sexual Activity   Alcohol use: Yes    Alcohol/week: 5.0 standard drinks of alcohol    Types: 5 Glasses of wine per week   Drug use: Never   Sexual activity: Not Currently    Partners: Female    Birth control/protection: None    PHYSICAL EXAM: Vitals:   10/16/22 1506  BP: 129/76  Pulse: 67   Body mass index is 22.4 kg/m. Weight: 64.9 kg (143 lb)   GENERAL: Alert, active, oriented x3  HEENT: Pupils equal reactive to light. Extraocular movements are intact. Sclera clear. Palpebral conjunctiva normal red color.Pharynx  clear.  NECK: Supple with no palpable mass and no adenopathy.  LUNGS: Sound clear with no rales rhonchi or wheezes.  HEART: Regular rhythm S1 and S2 without murmur.  ABDOMEN: Soft and depressible, nontender with no palpable mass, no hepatomegaly.  Right inguinal hernia, reducible.  EXTREMITIES: Well-developed well-nourished symmetrical with no dependent edema.  NEUROLOGICAL: Awake alert oriented, facial expression symmetrical, moving all extremities.  REVIEW OF DATA: I have reviewed the following data today: Appointment on 08/04/2022  Component Date Value   Glucose 08/04/2022 94    Sodium 08/04/2022 141    Potassium 08/04/2022 4.6    Chloride 08/04/2022 104    Carbon Dioxide (CO2) 08/04/2022 31.3    Urea Nitrogen (BUN) 08/04/2022 15    Creatinine 08/04/2022 1.0    Glomerular Filtration Ra* 08/04/2022 78    Calcium 08/04/2022  9.4    AST  08/04/2022 18    ALT  08/04/2022 19    Alk Phos (alkaline Phosp* 08/04/2022 61    Albumin 08/04/2022 4.6    Bilirubin, Total 08/04/2022 0.5    Protein, Total 08/04/2022 7.1    A/G Ratio 08/04/2022 1.8    Hemoglobin A1C 08/04/2022 5.8 (H)    Average Blood Glucose (C* 08/04/2022 120    Cholesterol, Total 08/04/2022 147    Triglyceride 08/04/2022 51    HDL (High Density Lipopr* 08/04/2022 66.5    LDL Calculated 08/04/2022 70    VLDL Cholesterol 08/04/2022 10    Cholesterol/HDL Ratio 08/04/2022 2.2      ASSESSMENT: Ronnie Hanson is a 77 y.o. male presenting for consultation for right versus bilateral inguinal hernia.    The patient presents with a symptomatic, reducible right versus bilateral inguinal hernia. Patient was oriented about the diagnosis of inguinal hernia and its implication. The patient was oriented about the treatment alternatives (observation vs surgical repair). Due to patient symptoms, repair is recommended. Patient oriented about the surgical procedure, the use of mesh and its risk of complications such as: infection,  bleeding, injury to vas deference, vasculature and testicle, injury to bowel or bladder, and chronic pain.   Non-recurrent unilateral inguinal hernia without obstruction or gangrene [K40.90]  PLAN: 1.  Robotic assisted laparoscopic right vs bilateral inguinal hernia repair with mesh [id number] 2.  Avoid taking aspirin 5 days before procedure 3.  Contact us if has any question or concern.   Patient and is wife verbalized understanding, all questions were answered, and were agreeable with the plan outlined above.    Herbert Pun, MD  Electronically signed by Herbert Pun, MD

## 2022-10-17 NOTE — H&P (View-Only) (Signed)
PATIENT PROFILE: Ronnie Hanson is a 77 y.o. male who presents to the Clinic for consultation at the request of [place], [place] for evaluation of right inguinal hernia.  PCP:  Harrold Donath, MD  HISTORY OF PRESENT ILLNESS: Mr. Ronnie Hanson reports feeling pain on the his right groin for the last couple of weeks.  He endorses that the pain is described as burning and pressure.  This is aggravated by physical activity.  He cannot identify any alleviating factor.  He does feel a bulge in the right groin that grows and is able to reduce.  Denies any episode of abdominal distention, nausea or vomiting.  Denies any previous groin surgery.   PROBLEM LIST: Problem List  Date Reviewed: 02/27/2022          Noted   Prediabetes 01/21/2018   Healthcare maintenance 01/20/2017   Overview    MEDICARE WELLNESS VISIT    PROVIDERS RENDERING CARE Dr. Ouida Sills, Optho in [facility], Dr Josefa Half   FUNCTIONAL ASSESSMENT  (1) Hearing: Demonstrates normal hearing in conversation.  (2) Risk of Falls: No reports of falls or abnormal balance. Gait is observed to be good upon observation.  (3) Home Safety; Home is safe and secure (4) Activities of Daily Living; Household chores and grooming are managed without problems. Personal finances are managed without problems.    DEPRESSION SCREENING There does not seem to be loss of interest in activities nor excess crying or changes in sleep or appetite.    COGNITIVE SCREENING Orientation is appropriate as are responses to questions and general conversation. No reports of forgetfulness or losing things.      PREVENTION PLAN Cardiovascular; On a statin Diabetes; Yearly glucose needed Colon Cancer; 1-17 colonoscopy and 6-22 with polyps and due in 5 years per patient Glaucoma; Yearly in [place] Pneumonia; pneumovax 2013 and prevnar 13 in 2018 Shingles; Zostavax 2011, Shingrix 5-19 Influenza; Yearly dpt in 3-04 and TDAP 2014 Smoking Cessation: NA     OTHER PERSONALIZED HEALTH ADVISE Walking during the day, low fat diet.    END OF LIFE CARE WANTS Full code, no long term support. Has a living will.          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 Normal LVF with LV ejection fraction of 68%.      Migraine 12/04/2013   Hypercholesteremia 12/04/2013   Essential hypertension, benign 12/04/2013   Anxiety 12/04/2013   Overview    secondary to stress at work       GENERAL REVIEW OF SYSTEMS:   General ROS: negative for - chills, fatigue, fever, weight gain or weight loss Allergy and Immunology ROS: negative for - hives  Hematological and Lymphatic ROS: negative for - bleeding problems or bruising, negative for palpable nodes Endocrine ROS: negative for - heat or cold intolerance, hair changes Respiratory ROS: negative for - cough, shortness of breath or wheezing Cardiovascular ROS: no chest pain or palpitations GI ROS: negative for nausea, vomiting, abdominal pain, diarrhea, constipation Musculoskeletal ROS: negative for - joint swelling or muscle pain Neurological ROS: negative for - confusion, syncope Dermatological ROS: negative for pruritus and rash Psychiatric: negative for anxiety, depression, difficulty sleeping and memory loss  MEDICATIONS: Current Outpatient Medications  Medication Sig Dispense Refill  amLODIPine (NORVASC) 2.5 MG tablet Take 1 tablet (2.5 mg total) by mouth once daily 90 tablet 1   aspirin 81 MG EC tablet Take 81 mg by mouth once daily.     carvediloL (COREG) 25 MG tablet TAKE 1 TABLET TWICE DAILY WITH MEALS 180 tablet 3   multivit-min/FA/lycopen/lutein (CENTRUM SILVER MEN ORAL) Take by mouth     psyllium (METAMUCIL) 0.52 gram capsule Take 0.52 g by mouth once daily     sertraline (ZOLOFT)  50 MG tablet Take 1 tablet (50 mg total) by mouth once daily 90 tablet 3   simvastatin (ZOCOR) 20 MG tablet Take 1 tablet (20 mg total) by mouth once daily 90 tablet 1   traZODone (DESYREL) 50 MG tablet Take 1 tablet (50 mg total) by mouth at bedtime 90 tablet 1   No current facility-administered medications for this visit.    ALLERGIES: Sulfa (sulfonamide antibiotics)  PAST MEDICAL HISTORY: Past Medical History:  Diagnosis Date   Anxiety    secondary to stress at work   Colonic polyp    Coronary artery disease    Diverticulosis 07/12/2010   Heart murmur    Hypercholesteremia    Hyperplastic colon polyp 04/27/1998   Hypertension    Migraine    Tubular adenoma of colon 04/27/1998    PAST SURGICAL HISTORY: Past Surgical History:  Procedure Laterality Date   COLONOSCOPY  07/12/2010   05/30/2005, 06/12/2000, 04/27/1998   COLONOSCOPY  09/07/2015   Tubular adenoma of colon/Repeat 50yr/PYO   APPENDECTOMY     TONSILLECTOMY       FAMILY HISTORY: Family History  Problem Relation Age of Onset   Colon cancer Mother    Alzheimer's disease Mother    Ovarian cancer Mother    Parkinsonism Father    High blood pressure (Hypertension) Father      SOCIAL HISTORY: Social History   Socioeconomic History   Marital status: Married  Tobacco Use   Smoking status: Never   Smokeless tobacco: Never  Vaping Use   Vaping Use: Never used  Substance and Sexual Activity   Alcohol use: Yes    Alcohol/week: 5.0 standard drinks of alcohol    Types: 5 Glasses of wine per week   Drug use: Never   Sexual activity: Not Currently    Partners: Female    Birth control/protection: None    PHYSICAL EXAM: Vitals:   10/16/22 1506  BP: 129/76  Pulse: 67   Body mass index is 22.4 kg/m. Weight: 64.9 kg (143 lb)   GENERAL: Alert, active, oriented x3  HEENT: Pupils equal reactive to light. Extraocular movements are intact. Sclera clear. Palpebral conjunctiva normal red color.Pharynx  clear.  NECK: Supple with no palpable mass and no adenopathy.  LUNGS: Sound clear with no rales rhonchi or wheezes.  HEART: Regular rhythm S1 and S2 without murmur.  ABDOMEN: Soft and depressible, nontender with no palpable mass, no hepatomegaly.  Right inguinal hernia, reducible.  EXTREMITIES: Well-developed well-nourished symmetrical with no dependent edema.  NEUROLOGICAL: Awake alert oriented, facial expression symmetrical, moving all extremities.  REVIEW OF DATA: I have reviewed the following data today: Appointment on 08/04/2022  Component Date Value   Glucose 08/04/2022 94    Sodium 08/04/2022 141    Potassium 08/04/2022 4.6    Chloride 08/04/2022 104    Carbon Dioxide (CO2) 08/04/2022 31.3    Urea Nitrogen (BUN) 08/04/2022 15    Creatinine 08/04/2022 1.0    Glomerular Filtration Ra* 08/04/2022 78    Calcium 08/04/2022  9.4    AST  08/04/2022 18    ALT  08/04/2022 19    Alk Phos (alkaline Phosp* 08/04/2022 61    Albumin 08/04/2022 4.6    Bilirubin, Total 08/04/2022 0.5    Protein, Total 08/04/2022 7.1    A/G Ratio 08/04/2022 1.8    Hemoglobin A1C 08/04/2022 5.8 (H)    Average Blood Glucose (C* 08/04/2022 120    Cholesterol, Total 08/04/2022 147    Triglyceride 08/04/2022 51    HDL (High Density Lipopr* 08/04/2022 66.5    LDL Calculated 08/04/2022 70    VLDL Cholesterol 08/04/2022 10    Cholesterol/HDL Ratio 08/04/2022 2.2      ASSESSMENT: Ronnie Hanson is a 77 y.o. male presenting for consultation for right versus bilateral inguinal hernia.    The patient presents with a symptomatic, reducible right versus bilateral inguinal hernia. Patient was oriented about the diagnosis of inguinal hernia and its implication. The patient was oriented about the treatment alternatives (observation vs surgical repair). Due to patient symptoms, repair is recommended. Patient oriented about the surgical procedure, the use of mesh and its risk of complications such as: infection,  bleeding, injury to vas deference, vasculature and testicle, injury to bowel or bladder, and chronic pain.   Non-recurrent unilateral inguinal hernia without obstruction or gangrene [K40.90]  PLAN: 1.  Robotic assisted laparoscopic right vs bilateral inguinal hernia repair with mesh [id number] 2.  Avoid taking aspirin 5 days before procedure 3.  Contact us if has any question or concern.   Patient and is wife verbalized understanding, all questions were answered, and were agreeable with the plan outlined above.    Herbert Pun, MD  Electronically signed by Herbert Pun, MD

## 2022-10-21 ENCOUNTER — Encounter
Admission: RE | Admit: 2022-10-21 | Discharge: 2022-10-21 | Disposition: A | Discharge status: Home / Self Care | Payer: Medicare HMO | Source: Ambulatory Visit | Attending: General Surgery | Admitting: General Surgery

## 2022-10-21 VITALS — Ht 67.0 in | Wt 144.0 lb

## 2022-10-21 DIAGNOSIS — I25118 Atherosclerotic heart disease of native coronary artery with other forms of angina pectoris: Secondary | ICD-10-CM

## 2022-10-21 DIAGNOSIS — Z01812 Encounter for preprocedural laboratory examination: Secondary | ICD-10-CM

## 2022-10-21 DIAGNOSIS — I10 Essential (primary) hypertension: Secondary | ICD-10-CM

## 2022-10-21 HISTORY — DX: Atherosclerotic heart disease of native coronary artery with other forms of angina pectoris: I25.118

## 2022-10-21 HISTORY — DX: Diverticulosis of intestine, part unspecified, without perforation or abscess without bleeding: K57.90

## 2022-10-21 HISTORY — DX: Prediabetes: R73.03

## 2022-10-21 NOTE — Patient Instructions (Addendum)
Your procedure is scheduled on: Wednesday, March 6 Report to the Registration Desk on the 1st floor of the [facility]. To find out your arrival time, please call [phone] between 1PM - 3PM on: Tuesday, March 5 If your arrival time is 6:00 am, do not arrive before that time as the [place] entrance doors do not open until 6:00 am.  REMEMBER: Instructions that are not followed completely may result in serious medical risk, up to and including death; or upon the discretion of your surgeon and anesthesiologist your surgery may need to be rescheduled.  Do not eat or drink after midnight the night before surgery.  No gum chewing or hard candies.  One week prior to surgery: starting February 28 Stop Anti-inflammatories (NSAIDS) such as Advil, Aleve, Ibuprofen, Motrin, Naproxen, Naprosyn and Aspirin based products such as Excedrin, Goody's Powder, BC Powder. Stop ANY OVER THE COUNTER supplements until after surgery. You may however, continue to take Tylenol if needed for pain up until the day of surgery.  Continue taking all prescribed medications with the exception of the following:  Per Dr. Darrol Poke note:  Aspirin - hold 5 days before surgery. Last day to take is Thursday, February 29. Resume AFTER surgery per surgeon instruction.  TAKE ONLY THESE MEDICATIONS THE MORNING OF SURGERY WITH A SIP OF WATER:  Carvedilol Sertraline (Zoloft)  No Alcohol for 24 hours before or after surgery.  No Smoking including e-cigarettes for 24 hours before surgery.  No chewable tobacco products for at least 6 hours before surgery.  No nicotine patches on the day of surgery.  Do not use any "recreational" drugs for at least a week (preferably 2 weeks) before your surgery.  Please be advised that the combination of cocaine and anesthesia may have negative outcomes, up to and including death. If you test positive for cocaine, your surgery will be cancelled.  On the morning of surgery brush  your teeth with toothpaste and water, you may rinse your mouth with mouthwash if you wish. Do not swallow any toothpaste or mouthwash.  Use CHG Soap as directed on instruction sheet.  Do not wear jewelry, make-up, hairpins, clips or nail polish.  Do not wear lotions, powders, or perfumes.   Do not shave body hair from the neck down 48 hours before surgery.  Contact lenses, hearing aids and dentures may not be worn into surgery.  Do not bring valuables to the hospital. [facility] is not responsible for any missing/lost belongings or valuables.   Notify your doctor if there is any change in your medical condition (cold, fever, infection).  Wear comfortable clothing (specific to your surgery type) to the hospital.  After surgery, you can help prevent lung complications by doing breathing exercises.  Take deep breaths and cough every 1-2 hours. Your doctor may order a device called an Incentive Spirometer to help you take deep breaths. When coughing or sneezing, hold a pillow firmly against your incision with both hands. This is called "splinting." Doing this helps protect your incision. It also decreases belly discomfort.  If you are being discharged the day of surgery, you will not be allowed to drive home. You will need a responsible individual to drive you home and stay with you for 24 hours after surgery.   If you are taking public transportation, you will need to have a responsible individual with you.  Please call the [place] Dept. at [phone] if you have any questions about these instructions.  Surgery Visitation  Policy:  Patients undergoing a surgery or procedure may have two family members or support persons with them as long as the person is not COVID-19 positive or experiencing its symptoms.      Preparing for Surgery with CHLORHEXIDINE GLUCONATE (CHG) Soap  Chlorhexidine Gluconate (CHG) Soap  o An antiseptic cleaner that kills germs and bonds  with the skin to continue killing germs even after washing  o Used for showering the night before surgery and morning of surgery  Before surgery, you can play an important role by reducing the number of germs on your skin.  CHG (Chlorhexidine gluconate) soap is an antiseptic cleanser which kills germs and bonds with the skin to continue killing germs even after washing.  Please do not use if you have an allergy to CHG or antibacterial soaps. If your skin becomes reddened/irritated stop using the CHG.  1. Shower the NIGHT BEFORE SURGERY and the MORNING OF SURGERY with CHG soap.  2. If you choose to wash your hair, wash your hair first as usual with your normal shampoo.  3. After shampooing, rinse your hair and body thoroughly to remove the shampoo.  4. Use CHG as you would any other liquid soap. You can apply CHG directly to the skin and wash gently with a scrungie or a clean washcloth.  5. Apply the CHG soap to your body only from the neck down. Do not use on open wounds or open sores. Avoid contact with your eyes, ears, mouth, and genitals (private parts). Wash face and genitals (private parts) with your normal soap.  6. Wash thoroughly, paying special attention to the area where your surgery will be performed.  7. Thoroughly rinse your body with warm water.  8. Do not shower/wash with your normal soap after using and rinsing off the CHG soap.  9. Pat yourself dry with a clean towel.  10. Wear clean pajamas to bed the night before surgery.  12. Place clean sheets on your bed the night of your first shower and do not sleep with pets.  13. Shower again with the CHG soap on the day of surgery prior to arriving at the hospital.  14. Do not apply any deodorants/lotions/powders.  15. Please wear clean clothes to the hospital.

## 2022-10-22 ENCOUNTER — Encounter
Admission: RE | Admit: 2022-10-22 | Discharge: 2022-10-22 | Disposition: A | Discharge status: Home / Self Care | Payer: Medicare HMO | Source: Ambulatory Visit | Attending: General Surgery | Admitting: General Surgery

## 2022-10-22 DIAGNOSIS — I2510 Atherosclerotic heart disease of native coronary artery without angina pectoris: Secondary | ICD-10-CM | POA: Diagnosis not present

## 2022-10-22 DIAGNOSIS — I25118 Atherosclerotic heart disease of native coronary artery with other forms of angina pectoris: Secondary | ICD-10-CM | POA: Insufficient documentation

## 2022-10-22 DIAGNOSIS — Z01812 Encounter for preprocedural laboratory examination: Secondary | ICD-10-CM

## 2022-10-22 DIAGNOSIS — I10 Essential (primary) hypertension: Secondary | ICD-10-CM | POA: Insufficient documentation

## 2022-10-22 DIAGNOSIS — R54 Age-related physical debility: Secondary | ICD-10-CM | POA: Diagnosis not present

## 2022-10-22 DIAGNOSIS — Z01818 Encounter for other preprocedural examination: Secondary | ICD-10-CM | POA: Diagnosis not present

## 2022-10-22 LAB — CBC
HCT: 39.1 % (ref 39.0–52.0)
Hemoglobin: 13 g/dL (ref 13.0–17.0)
MCH: 31 pg (ref 26.0–34.0)
MCHC: 33.2 g/dL (ref 30.0–36.0)
MCV: 93.3 fL (ref 80.0–100.0)
Platelets: 229 10*3/uL (ref 150–400)
RBC: 4.19 MIL/uL — ABNORMAL LOW (ref 4.22–5.81)
RDW: 12.9 % (ref 11.5–15.5)
WBC: 5.3 10*3/uL (ref 4.0–10.5)
nRBC: 0 % (ref 0.0–0.2)

## 2022-10-27 ENCOUNTER — Encounter: Payer: Self-pay | Admitting: General Surgery

## 2022-10-27 NOTE — Progress Notes (Signed)
Perioperative / Anesthesia Services  Pre-Admission Testing Clinical Review / Preoperative Anesthesia Consult  Date: 10/27/22  Patient Demographics:  Name: Ronnie Hanson DOB:   06-21-1946 MRN:   ID:[id number]  Planned Surgical Procedure(s):    Case: [id number] Date/Time: 10/29/22 1056   Procedure: XI ROBOTIC ASSISTED BILATERAL INGUINAL HERNIA (Bilateral: Groin)   Anesthesia type: General   Pre-op diagnosis: K40.90 non recurrent unilateral inguinal hernia w/o obstruction or gangrene   Location: [facility] OR ROOM 07 / [facility]   Surgeons: Herbert Pun, MD     NOTE: Available PAT nursing documentation and vital signs have been reviewed. Clinical nursing staff has updated patient's PMH/PSHx, current medication list, and drug allergies/intolerances to ensure comprehensive history available to assist in medical decision making as it pertains to the aforementioned surgical procedure and anticipated anesthetic course. Extensive review of available clinical information personally performed. [place] PMH and PSHx updated with any diagnoses/procedures that  may have been inadvertently omitted during his intake with the pre-admission testing department's nursing staff.  Clinical Discussion:  Ronnie Hanson is a 77 y.o. male who is submitted for pre-surgical anesthesia review and clearance prior to him undergoing the above procedure. Patient has never been a smoker. Pertinent PMH includes: CAD, angina, cardiac murmur, HTN, HLD, prediabetes, sleep difficulties, anxiety.  Patient is followed by cardiology Saralyn Pilar, MD). He was last seen in the cardiology clinic on 09/03/2022; notes reviewed. At the time of his clinic visit, patient doing well overall from a cardiovascular perspective.  Patient reporting episodes of anginal symptoms characterized by "occasional chest tightness with exertion". Symptom reported to be stable and at baseline.  Patient denied any  associated shortness of breath, PND, orthopnea, palpitations, significant peripheral edema, weakness, fatigue, vertiginous symptoms, or presyncope/syncope. Patient with a past medical history significant for cardiovascular diagnoses. Documented physical exam was grossly benign, providing no evidence of acute exacerbation and/or decompensation of the patient's known cardiovascular conditions.  Patient underwent diagnostic LEFT heart catheterization on 03/13/2015 single-vessel CAD; 50% proximal LAD, 40% mid LAD-1, and 60% mid LAD-2.  Given the nonobstructive nature of patient's arterial disease, the decision was made to defer further intervention opting for medical management.  Most recent myocardial perfusion imaging study performed on 02/21/2022 revealed a normal left ventricular systolic function with a normal EF of 62%.  There were no regional wall motion abnormalities.  SPECT images demonstrated a small fixed perfusion abnormality of mild intensity present in the inferior region on stress images.  Patient able to meet [facility] and was able to perform 11.60 METS.  Study findings consistent with mild inferior scar without significant ischemia.  Blood pressure well controlled at 122/80 mmHg on currently prescribed CCB (amlodipine) and beta-blocker (carvedilol) therapies.  Patient is on simvastatin for his HLD diagnosis and ASCVD prevention.  Patient has a prediabetes diagnosis; last HgbA1c was 5.8% when checked on 08/04/2022.  He does not have an OSAH diagnosis. Functional capacity, as defined by DASI, is documented as being >/= 4 METS. No changes were made to his medication regimen during his visit with cardiology.  Patient scheduled to follow-up with outpatient cardiology in 6 months or sooner if needed.  Ronnie Hanson is scheduled for an elective XI ROBOTIC ASSISTED BILATERAL INGUINAL HERNIA on 10/29/2022 with Dr. Herbert Pun, MD.  Given patient's past medical history significant for  cardiovascular diagnoses, presurgical cardiac clearance was sought by the PAT team. Per cardiology, "this patient is optimized for surgery and may proceed with the planned procedural course  with a ACCEPTABLE risk of significant perioperative cardiovascular complications".    In review of his medication reconciliation, it is noted that patient is currently on prescribed daily antiplatelet therapy. He has been instructed on recommendations for holding his daily low-dose ASA for 5 days prior to his procedure with plans to restart as soon as postoperative bleeding risk felt to be minimized by his attending surgeon. The patient has been instructed that his last dose of his ASA should be on 10/23/2022.  Patient denies previous perioperative complications with anesthesia in the past. In review of the available records, it is noted that patient underwent a general anesthetic course here at [facility] (ASA II) in 01/2021 without documented complications.      10/21/2022    8:20 AM 02/19/2021   10:30 AM 02/19/2021   10:20 AM  Vitals with BMI  Height '5\' 7"'$     Weight 144 lbs    BMI 0000000    Systolic  [id number]  Diastolic  76 66  Pulse  65 61    Providers/Specialists:   NOTE: Primary physician provider listed below. Patient may have been seen by APP or partner within same practice.   PROVIDER ROLE / SPECIALTY LAST Tanna Savoy, MD General Surgery (Surgeon) 10/16/2022  Kirk Ruths, MD Primary Care Provider 08/11/2022  Isaias Cowman, MD Cardiology 09/03/2022   Allergies:  Sulfa antibiotics  Current Home Medications:   No current facility-administered medications for this encounter.    amLODipine (NORVASC) 2.5 MG tablet   aspirin 81 MG EC tablet   carvedilol (COREG) 25 MG tablet   Multiple Vitamins-Minerals (CENTRUM SILVER 50+MEN PO)   polyethylene glycol (MIRALAX / GLYCOLAX) 17 g packet   psyllium (REGULOID) 0.52 g capsule    sertraline (ZOLOFT) 50 MG tablet   simvastatin (ZOCOR) 20 MG tablet   traZODone (DESYREL) 50 MG tablet   History:   Past Medical History:  Diagnosis Date   Anxiety    Colonic polyp    Coronary artery disease of native artery of native heart with stable angina pectoris ([place])    a.) LHC 03/13/2015: 50% pLAD, 40/60% mLAD - med mgmt   Diverticulosis    Heart murmur    Hypercholesteremia    Hypertension    Migraine    Pre-diabetes    Sleep difficulties    a.) takes trazodone PRN   Past Surgical History:  Procedure Laterality Date   CARDIAC CATHETERIZATION N/A 03/13/2015   Procedure: Left Heart Cath;  Surgeon: Isaias Cowman, MD;  Location: [place] CV LAB;  Service: Cardiovascular;  Laterality: N/A;   COLONOSCOPY N/A 09/07/2015   Procedure: COLONOSCOPY;  Surgeon: Hulen Luster, MD;  Location: [facility] ENDOSCOPY;  Service: Gastroenterology;  Laterality: N/A;   COLONOSCOPY  2011   COLONOSCOPY WITH PROPOFOL N/A 02/19/2021   Procedure: COLONOSCOPY WITH PROPOFOL;  Surgeon: Lucilla Lame, MD;  Location: [facility] ENDOSCOPY;  Service: Endoscopy;  Laterality: N/A;   TONSILLECTOMY  1953   No family history on file. Social History   Tobacco Use   Smoking status: Never   Smokeless tobacco: Never  Vaping Use   Vaping Use: Never used  Substance Use Topics   Alcohol use: Yes    Comment: glass of wine with dinner   Drug use: No    Pertinent Clinical Results:  LABS:   No visits with results within 3 Day(s) from this visit.  Latest known visit with results is:  Hospital Outpatient Visit on 10/22/2022  Component Date Value Ref Range Status   WBC 10/22/2022 5.3  4.0 - 10.5 K/uL Final   RBC 10/22/2022 4.19 (L)  4.22 - 5.81 MIL/uL Final   Hemoglobin 10/22/2022 13.0  13.0 - 17.0 g/dL Final   HCT 10/22/2022 39.1  39.0 - 52.0 % Final   MCV 10/22/2022 93.3  80.0 - 100.0 fL Final   MCH 10/22/2022 31.0  26.0 - 34.0 pg Final   MCHC 10/22/2022 33.2  30.0 - 36.0 g/dL Final   RDW 10/22/2022 12.9   11.5 - 15.5 % Final   Platelets 10/22/2022 229  150 - 400 K/uL Final   nRBC 10/22/2022 0.0  0.0 - 0.2 % Final   Performed at [facility], [address] [place], [place] [address]    Ref Range & Units 08/04/2022  Glucose 70 - 110 mg/dL 94  Sodium 136 - 145 mmol/L 141  Potassium 3.6 - 5.1 mmol/L 4.6  Chloride 97 - 109 mmol/L 104  Carbon Dioxide (CO2) 22.0 - 32.0 mmol/L 31.3  Urea Nitrogen (BUN) 7 - 25 mg/dL 15  Creatinine 0.7 - 1.3 mg/dL 1.0  Glomerular Filtration Rate (eGFR) >60 mL/min/1.73sq m 78  Calcium 8.7 - 10.3 mg/dL 9.4  AST 8 - 39 U/L 18  ALT 6 - 57 U/L 19  Alk Phos (alkaline Phosphatase) 34 - 104 U/L 61  Albumin 3.5 - 4.8 g/dL 4.6  Bilirubin, Total 0.3 - 1.2 mg/dL 0.5  Protein, Total 6.1 - 7.9 g/dL 7.1  A/G Ratio 1.0 - 5.0 gm/dL 1.8  Resulting Agency  [place] - LAB  Specimen Collected: 08/04/22 08:36   Performed by: [place]: 08/04/22 16:16  Received From: [facility]  Result Received: 10/14/22 13:53     Ref Range & Units 08/04/2022  Hemoglobin A1C 4.2 - 5.6 % 5.8 High   Average Blood Glucose (Calc) mg/dL 120  Resulting Agency     Specimen Collected: 08/04/22 08:36   Performed by: [place]: 08/04/22 10:39  Received From: [facility]  Result Received: 10/14/22 13:53     ECG: Date: 10/22/2022 Time ECG obtained: 1435 PM Rate: 62 bpm Rhythm: normal sinus Axis (leads I and aVF): Normal Intervals: PR 154 ms. QRS 90 ms. QTc 416 ms. ST segment and T wave changes: No evidence of acute ST segment elevation or depression Comparison: Similar to previous tracing obtained on 01/18/2015   IMAGING / PROCEDURES: MYOCARDIAL PERFUSION IMAGING STUDY (LEXISCAN) performed on 02/21/2022 Normal left ventricular systolic function with a normal LVEF of 60 to % Normal myocardial thickening and wall motion Left ventricular cavity size normal SPECT images  demonstrate a small mixed perfusion abnormality of mild intensity present in the inferior region on stress images consistent with inferior scar without significant ischemia  LEFT HEART CATHETERIZATION AND CORONARY ANGIOGRAPHY performed on 03/13/2015 Normal left ventricular systolic function with hyperdynamic LVEF of 68% Coronary artery disease  50% stenosis of the proximal LAD 40% stenosis of the mid LAD-1 60% stenosis of the mid LAD-2 Recommendations Aggressive risk factor modification and medical therapy    Impression and Plan:  Ronnie Hanson has been referred for pre-anesthesia review and clearance prior to him undergoing the planned anesthetic and procedural courses. Available labs, pertinent testing, and imaging results were personally reviewed by me in preparation for upcoming operative/procedural course. [facility] medical record has been updated following extensive record review and patient interview with PAT staff.  This patient has been appropriately cleared by cardiology with an overall ACCEPTABLE risk of significant perioperative cardiovascular complications. Based on clinical review performed today (10/27/22), barring any significant acute changes in the patient's overall condition, it is anticipated that he will be able to proceed with the planned surgical intervention. Any acute changes in clinical condition may necessitate his procedure being postponed and/or cancelled. Patient will meet with anesthesia team (MD and/or CRNA) on the day of his procedure for preoperative evaluation/assessment. Questions regarding anesthetic course will be fielded at that time.   Pre-surgical instructions were reviewed with the patient during his PAT appointment, and questions were fielded to satisfaction by PAT clinical staff. He has been instructed on which medications that he will need to hold prior to surgery, as well as the ones that have been deemed safe/appropriate to take of the  day of his procedure. As part of the general education provided by PAT, patient made aware both verbally and in writing, that he would need to abstain from the use of any illegal substances during his perioperative course.  He was advised that failure to follow the provided instructions could necessitate case cancellation or result serious perioperative complications up to and including death. Patient encouraged to contact PAT and/or his surgeon's office to discuss any questions or concerns that may arise prior to surgery; verbalized understanding.   Honor Loh, MSN, APRN, FNP-C, CEN [facility]  Peri-operative Services Nurse Practitioner Phone: [phone] Fax: [phone] 10/27/22 10:12 AM  NOTE: This note has been prepared using Dragon dictation software. Despite my best ability to proofread, there is always the potential that unintentional transcriptional errors may still occur from this process.

## 2022-10-29 ENCOUNTER — Encounter
Admission: RE | Disposition: A | Discharge status: Home / Self Care | Payer: Self-pay | Source: Home / Self Care | Attending: General Surgery

## 2022-10-29 ENCOUNTER — Other Ambulatory Visit: Payer: Self-pay

## 2022-10-29 ENCOUNTER — Ambulatory Visit: Payer: Medicare HMO | Admitting: Urgent Care

## 2022-10-29 ENCOUNTER — Encounter: Payer: Self-pay | Admitting: General Surgery

## 2022-10-29 ENCOUNTER — Ambulatory Visit
Admission: RE | Admit: 2022-10-29 | Discharge: 2022-10-29 | Disposition: A | Discharge status: Home / Self Care | Payer: Medicare HMO | Attending: General Surgery | Admitting: General Surgery

## 2022-10-29 DIAGNOSIS — K4020 Bilateral inguinal hernia, without obstruction or gangrene, not specified as recurrent: Secondary | ICD-10-CM | POA: Diagnosis not present

## 2022-10-29 DIAGNOSIS — E7800 Pure hypercholesterolemia, unspecified: Secondary | ICD-10-CM | POA: Diagnosis not present

## 2022-10-29 DIAGNOSIS — R7303 Prediabetes: Secondary | ICD-10-CM | POA: Diagnosis not present

## 2022-10-29 DIAGNOSIS — I25110 Atherosclerotic heart disease of native coronary artery with unstable angina pectoris: Secondary | ICD-10-CM | POA: Diagnosis not present

## 2022-10-29 DIAGNOSIS — I10 Essential (primary) hypertension: Secondary | ICD-10-CM | POA: Diagnosis not present

## 2022-10-29 DIAGNOSIS — K4090 Unilateral inguinal hernia, without obstruction or gangrene, not specified as recurrent: Secondary | ICD-10-CM | POA: Diagnosis not present

## 2022-10-29 DIAGNOSIS — Z8719 Personal history of other diseases of the digestive system: Secondary | ICD-10-CM | POA: Diagnosis not present

## 2022-10-29 DIAGNOSIS — I2510 Atherosclerotic heart disease of native coronary artery without angina pectoris: Secondary | ICD-10-CM | POA: Insufficient documentation

## 2022-10-29 DIAGNOSIS — Z86010 Personal history of colonic polyps: Secondary | ICD-10-CM | POA: Diagnosis not present

## 2022-10-29 DIAGNOSIS — F419 Anxiety disorder, unspecified: Secondary | ICD-10-CM | POA: Diagnosis not present

## 2022-10-29 HISTORY — DX: Sleep disorder, unspecified: G47.9

## 2022-10-29 HISTORY — PX: INSERTION OF MESH: SHX5868

## 2022-10-29 SURGERY — REPAIR, HERNIA, INGUINAL, BILATERAL, ROBOT-ASSISTED
Anesthesia: General | Site: Inguinal

## 2022-10-29 MED ORDER — ORAL CARE MOUTH RINSE: 15.0000 mL | Freq: Once | OROMUCOSAL | Status: AC

## 2022-10-29 MED ORDER — LIDOCAINE HCL (CARDIAC) PF 100 MG/5ML IV SOSY
PREFILLED_SYRINGE | INTRAVENOUS | Status: DC | PRN
  Administered 2022-10-29: 40 mg via INTRAVENOUS

## 2022-10-29 MED ORDER — CHLORHEXIDINE GLUCONATE 0.12 % MT SOLN
OROMUCOSAL | Status: AC
  Filled 2022-10-29: qty 15

## 2022-10-29 MED ORDER — EPHEDRINE SULFATE (PRESSORS) 50 MG/ML IJ SOLN
INTRAMUSCULAR | Status: DC | PRN
  Administered 2022-10-29: 10 mg via INTRAVENOUS
  Administered 2022-10-29: 5 mg via INTRAVENOUS
  Administered 2022-10-29: 10 mg via INTRAVENOUS

## 2022-10-29 MED ORDER — SUGAMMADEX SODIUM 200 MG/2ML IV SOLN
INTRAVENOUS | Status: DC | PRN
  Administered 2022-10-29: 150 mg via INTRAVENOUS
  Administered 2022-10-29: 50 mg via INTRAVENOUS

## 2022-10-29 MED ORDER — FENTANYL CITRATE (PF) 100 MCG/2ML IJ SOLN: 25.0000 ug | INTRAMUSCULAR | Status: DC | PRN

## 2022-10-29 MED ORDER — EPHEDRINE 5 MG/ML INJ
INTRAVENOUS | Status: AC
  Filled 2022-10-29: qty 5

## 2022-10-29 MED ORDER — OXYCODONE HCL 5 MG PO TABS
5.0000 mg | ORAL_TABLET | Freq: Once | ORAL | Status: AC | PRN
  Administered 2022-10-29: 5 mg via ORAL

## 2022-10-29 MED ORDER — OXYCODONE HCL 5 MG PO TABS
ORAL_TABLET | ORAL | Status: AC
  Filled 2022-10-29: qty 1

## 2022-10-29 MED ORDER — ROCURONIUM BROMIDE 100 MG/10ML IV SOLN
INTRAVENOUS | Status: DC | PRN
  Administered 2022-10-29: 10 mg via INTRAVENOUS
  Administered 2022-10-29: 50 mg via INTRAVENOUS

## 2022-10-29 MED ORDER — 0.9 % SODIUM CHLORIDE (POUR BTL) OPTIME
TOPICAL | Status: DC | PRN
  Administered 2022-10-29: 500 mL

## 2022-10-29 MED ORDER — FAMOTIDINE 20 MG PO TABS
ORAL_TABLET | ORAL | Status: AC
  Filled 2022-10-29: qty 1

## 2022-10-29 MED ORDER — ACETAMINOPHEN 10 MG/ML IV SOLN: 1000.0000 mg | Freq: Once | INTRAVENOUS | Status: DC | PRN

## 2022-10-29 MED ORDER — ROCURONIUM BROMIDE 10 MG/ML (PF) SYRINGE
PREFILLED_SYRINGE | INTRAVENOUS | Status: AC
  Filled 2022-10-29: qty 10

## 2022-10-29 MED ORDER — LIDOCAINE HCL (PF) 2 % IJ SOLN
INTRAMUSCULAR | Status: AC
  Filled 2022-10-29: qty 5

## 2022-10-29 MED ORDER — ACETAMINOPHEN 10 MG/ML IV SOLN
INTRAVENOUS | Status: AC
  Filled 2022-10-29: qty 100

## 2022-10-29 MED ORDER — ACETAMINOPHEN 10 MG/ML IV SOLN
INTRAVENOUS | Status: DC | PRN
  Administered 2022-10-29: 1000 mg via INTRAVENOUS

## 2022-10-29 MED ORDER — ONDANSETRON HCL 4 MG/2ML IJ SOLN
INTRAMUSCULAR | Status: AC
  Filled 2022-10-29: qty 2

## 2022-10-29 MED ORDER — OXYCODONE HCL 5 MG/5ML PO SOLN: 5.0000 mg | Freq: Once | ORAL | Status: AC | PRN

## 2022-10-29 MED ORDER — CHLORHEXIDINE GLUCONATE 0.12 % MT SOLN
15.0000 mL | Freq: Once | OROMUCOSAL | Status: AC
  Administered 2022-10-29: 15 mL via OROMUCOSAL

## 2022-10-29 MED ORDER — ONDANSETRON HCL 4 MG/2ML IJ SOLN: 4.0000 mg | Freq: Once | INTRAMUSCULAR | Status: DC | PRN

## 2022-10-29 MED ORDER — GLYCOPYRROLATE 0.2 MG/ML IJ SOLN
INTRAMUSCULAR | Status: DC | PRN
  Administered 2022-10-29: .2 mg via INTRAVENOUS

## 2022-10-29 MED ORDER — ONDANSETRON HCL 4 MG/2ML IJ SOLN
INTRAMUSCULAR | Status: DC | PRN
  Administered 2022-10-29: 4 mg via INTRAVENOUS

## 2022-10-29 MED ORDER — FENTANYL CITRATE (PF) 100 MCG/2ML IJ SOLN
INTRAMUSCULAR | Status: AC
  Filled 2022-10-29: qty 2

## 2022-10-29 MED ORDER — FAMOTIDINE 20 MG PO TABS
20.0000 mg | ORAL_TABLET | Freq: Once | ORAL | Status: AC
  Administered 2022-10-29: 20 mg via ORAL

## 2022-10-29 MED ORDER — CEFAZOLIN SODIUM-DEXTROSE 2-4 GM/100ML-% IV SOLN
INTRAVENOUS | Status: AC
  Filled 2022-10-29: qty 100

## 2022-10-29 MED ORDER — HYDROCODONE-ACETAMINOPHEN 5-325 MG PO TABS: 1.0000 | ORAL_TABLET | ORAL | 0 refills | Status: AC | PRN

## 2022-10-29 MED ORDER — BUPIVACAINE HCL (PF) 0.25 % IJ SOLN
INTRAMUSCULAR | Status: AC
  Filled 2022-10-29: qty 30

## 2022-10-29 MED ORDER — DEXAMETHASONE SODIUM PHOSPHATE 10 MG/ML IJ SOLN
INTRAMUSCULAR | Status: DC | PRN
  Administered 2022-10-29: 5 mg via INTRAVENOUS

## 2022-10-29 MED ORDER — PROPOFOL 10 MG/ML IV BOLUS
INTRAVENOUS | Status: DC | PRN
  Administered 2022-10-29: 50 mg via INTRAVENOUS
  Administered 2022-10-29: 150 mg via INTRAVENOUS

## 2022-10-29 MED ORDER — CEFAZOLIN SODIUM-DEXTROSE 2-4 GM/100ML-% IV SOLN
2.0000 g | INTRAVENOUS | Status: AC
  Administered 2022-10-29: 2 g via INTRAVENOUS

## 2022-10-29 MED ORDER — FENTANYL CITRATE (PF) 100 MCG/2ML IJ SOLN
INTRAMUSCULAR | Status: DC | PRN
  Administered 2022-10-29 (×2): 50 ug via INTRAVENOUS

## 2022-10-29 MED ORDER — BUPIVACAINE-EPINEPHRINE 0.25% -1:200000 IJ SOLN
INTRAMUSCULAR | Status: DC | PRN
  Administered 2022-10-29: 30 mL

## 2022-10-29 MED ORDER — LACTATED RINGERS IV SOLN: INTRAVENOUS | Status: DC

## 2022-10-29 MED ORDER — LACTATED RINGERS IV SOLN: INTRAVENOUS | Status: DC | PRN

## 2022-10-29 SURGICAL SUPPLY — 50 items
ADH SKN CLS APL DERMABOND .7 (GAUZE/BANDAGES/DRESSINGS) ×2
BAG PRESSURE INF REUSE 1000 (BAG) IMPLANT
BLADE SURG SZ11 CARB STEEL (BLADE) ×2 IMPLANT
COVER TIP SHEARS 8 DVNC (MISCELLANEOUS) ×2 IMPLANT
COVER TIP SHEARS 8MM DA VINCI (MISCELLANEOUS) ×2
COVER WAND RF STERILE (DRAPES) ×2 IMPLANT
DERMABOND ADVANCED .7 DNX12 (GAUZE/BANDAGES/DRESSINGS) ×2 IMPLANT
DRAPE ARM DVNC X/XI (DISPOSABLE) ×6 IMPLANT
DRAPE COLUMN DVNC XI (DISPOSABLE) ×2 IMPLANT
DRAPE DA VINCI XI ARM (DISPOSABLE) ×6
DRAPE DA VINCI XI COLUMN (DISPOSABLE) ×2
ELECT REM PT RETURN 9FT ADLT (ELECTROSURGICAL) ×2
ELECTRODE REM PT RTRN 9FT ADLT (ELECTROSURGICAL) ×2 IMPLANT
GLOVE BIO SURGEON STRL SZ 6.5 (GLOVE) ×4 IMPLANT
GLOVE BIOGEL PI IND STRL 6.5 (GLOVE) ×4 IMPLANT
GOWN STRL REUS W/ TWL LRG LVL3 (GOWN DISPOSABLE) ×6 IMPLANT
GOWN STRL REUS W/TWL LRG LVL3 (GOWN DISPOSABLE) ×6
IRRIGATOR SUCT 8 DISP DVNC XI (IRRIGATION / IRRIGATOR) IMPLANT
IRRIGATOR SUCTION 8MM XI DISP (IRRIGATION / IRRIGATOR)
IV CATH ANGIO 12GX3 LT BLUE (NEEDLE) IMPLANT
IV NS 1000ML (IV SOLUTION)
IV NS 1000ML BAXH (IV SOLUTION) IMPLANT
KIT PINK PAD W/HEAD ARE REST (MISCELLANEOUS) ×2
KIT PINK PAD W/HEAD ARM REST (MISCELLANEOUS) ×2 IMPLANT
LABEL OR SOLS (LABEL) IMPLANT
MANIFOLD NEPTUNE II (INSTRUMENTS) ×2 IMPLANT
MESH 3DMAX MID 5X7 LT XLRG (Mesh General) IMPLANT
MESH 3DMAX MID 5X7 RT XLRG (Mesh General) IMPLANT
NDL INSUFFLATION 14GA 120MM (NEEDLE) ×2 IMPLANT
NEEDLE HYPO 22GX1.5 SAFETY (NEEDLE) ×2 IMPLANT
NEEDLE INSUFFLATION 14GA 120MM (NEEDLE) ×2 IMPLANT
OBTURATOR OPTICAL STANDARD 8MM (TROCAR) ×2
OBTURATOR OPTICAL STND 8 DVNC (TROCAR) ×2
OBTURATOR OPTICALSTD 8 DVNC (TROCAR) ×2 IMPLANT
PACK LAP CHOLECYSTECTOMY (MISCELLANEOUS) ×2 IMPLANT
SEAL CANN UNIV 5-8 DVNC XI (MISCELLANEOUS) ×6 IMPLANT
SEAL XI 5MM-8MM UNIVERSAL (MISCELLANEOUS) ×6
SET TUBE SMOKE EVAC HIGH FLOW (TUBING) ×2 IMPLANT
SOL ELECTROSURG ANTI STICK (MISCELLANEOUS) ×2
SOLUTION ELECTROSURG ANTI STCK (MISCELLANEOUS) ×2 IMPLANT
SUT MNCRL 4-0 (SUTURE) ×4
SUT MNCRL 4-0 27XMFL (SUTURE) ×4
SUT VIC AB 2-0 SH 27 (SUTURE) ×2
SUT VIC AB 2-0 SH 27XBRD (SUTURE) ×2 IMPLANT
SUT VLOC 90 S/L VL9 GS22 (SUTURE) ×2 IMPLANT
SUTURE MNCRL 4-0 27XMF (SUTURE) ×2 IMPLANT
TAPE TRANSPORE STRL 2 31045 (GAUZE/BANDAGES/DRESSINGS) IMPLANT
TRAP FLUID SMOKE EVACUATOR (MISCELLANEOUS) ×2 IMPLANT
TRAY FOLEY MTR SLVR 16FR STAT (SET/KITS/TRAYS/PACK) ×2 IMPLANT
WATER STERILE IRR 500ML POUR (IV SOLUTION) ×2 IMPLANT

## 2022-10-29 NOTE — Anesthesia Procedure Notes (Signed)
Procedure Name: Intubation Date/Time: 10/29/2022 11:20 AM  Performed by: Hilbert Odor, CRNAPre-anesthesia Checklist: Patient identified, Patient being monitored, Timeout performed, Emergency Drugs available and Suction available Patient Re-evaluated:Patient Re-evaluated prior to induction Oxygen Delivery Method: Circle system utilized Preoxygenation: Pre-oxygenation with 100% oxygen Induction Type: IV induction Ventilation: Mask ventilation without difficulty Laryngoscope Size: McGraph and 4 Grade View: Grade I Tube type: Oral Tube size: 7.0 (per MDA request) mm Number of attempts: 1 Airway Equipment and Method: Stylet Placement Confirmation: ETT inserted through vocal cords under direct vision, positive ETCO2 and breath sounds checked- equal and bilateral Secured at: 22 cm Tube secured with: Tape Dental Injury: Teeth and Oropharynx as per pre-operative assessment

## 2022-10-29 NOTE — Anesthesia Preprocedure Evaluation (Signed)
Anesthesia Evaluation  Patient identified by MRN, date of birth, ID band Patient awake    Reviewed: Allergy & Precautions, NPO status , Patient's Chart, lab work & pertinent test results  History of Anesthesia Complications Negative for: history of anesthetic complications  Airway Mallampati: III  TM Distance: >3 FB Neck ROM: Full    Dental no notable dental hx. (+) Teeth Intact   Pulmonary neg pulmonary ROS, neg sleep apnea, neg COPD, Patient abstained from smoking.Not current smoker   Pulmonary exam normal breath sounds clear to auscultation       Cardiovascular Exercise Tolerance: Good METShypertension, + CAD  (-) Past MI (-) dysrhythmias  Rhythm:Regular Rate:Normal - Systolic murmurs    Neuro/Psych  Headaches PSYCHIATRIC DISORDERS Anxiety        GI/Hepatic ,neg GERD  ,,(+)     (-) substance abuse    Endo/Other  neg diabetes    Renal/GU negative Renal ROS     Musculoskeletal   Abdominal   Peds  Hematology   Anesthesia Other Findings Past Medical History: No date: Anxiety No date: Colonic polyp No date: Coronary artery disease of native artery of native heart  with stable angina pectoris ([place])     Comment:  a.) LHC 03/13/2015: 50% pLAD, 40/60% mLAD - med mgmt No date: Diverticulosis No date: Heart murmur No date: Hypercholesteremia No date: Hypertension No date: Migraine No date: Pre-diabetes No date: Sleep difficulties     Comment:  a.) takes trazodone PRN  Reproductive/Obstetrics                             Anesthesia Physical Anesthesia Plan  ASA: 2  Anesthesia Plan: General   Post-op Pain Management: Ofirmev IV (intra-op)* and Toradol IV (intra-op)*   Induction: Intravenous  PONV Risk Score and Plan: 3 and Ondansetron and Dexamethasone  Airway Management Planned: Oral ETT and Video Laryngoscope Planned  Additional Equipment: None  Intra-op Plan:    Post-operative Plan: Extubation in OR  Informed Consent: I have reviewed the patients History and Physical, chart, labs and discussed the procedure including the risks, benefits and alternatives for the proposed anesthesia with the patient or authorized representative who has indicated his/her understanding and acceptance.     Dental advisory given  Plan Discussed with: CRNA and Surgeon  Anesthesia Plan Comments: (Discussed risks of anesthesia with patient, including PONV, sore throat, lip/dental/eye damage, post operative cognitive dysfunction. Rare risks discussed as well, such as cardiorespiratory and neurological sequelae, and allergic reactions. Discussed the role of CRNA in patient's perioperative care. Patient understands.)       Anesthesia Quick Evaluation

## 2022-10-29 NOTE — Interval H&P Note (Signed)
History and Physical Interval Note:  10/29/2022 10:18 AM  Ronnie Hanson  has presented today for surgery, with the diagnosis of K40.90 non recurrent unilateral inguinal hernia w/o obstruction or gangrene.  The various methods of treatment have been discussed with the patient and family. After consideration of risks, benefits and other options for treatment, the patient has consented to  Procedure(s): XI ROBOTIC ASSISTED BILATERAL INGUINAL HERNIA (Bilateral) as a surgical intervention.  The patient's history has been reviewed, patient examined, no change in status, stable for surgery.  I have reviewed the patient's chart and labs.  Questions were answered to the patient's satisfaction.     Herbert Pun

## 2022-10-29 NOTE — Op Note (Signed)
Preoperative diagnosis: Bilateral inguinal hernia.   Postoperative diagnosis: Bilateral inguinal hernia.  Procedure: Robotic assisted Laparoscopic Transabdominal preperitoneal laparoscopic (TAPP) repair of bilateral inguinal hernia.  Anesthesia: GETA  Surgeon: Dr. Windell Moment  Wound Classification: Clean  Indications:  Patient is a 77 y.o. male developed a symptomatic bilateral inguinal hernia. Repair was indicated.  Findings: 1.  Right indirect and right direct inguinal hernia identified 2. Vas deferens and cord structures identified and preserved 3. Bard Extra Large 3D Max MID Anatomical mesh used for repair 4. Adequate hemostasis.   Description of procedure:  The patient was taken to the operating room and the correct side of surgery was verified. The patient was placed supine with right arm tucked at the side. After obtaining adequate anesthesia, the patient's abdomen was prepped and draped in standard sterile fashion. A time-out was completed verifying correct patient, procedure, site, positioning, and implant(s) and/or special equipment prior to beginning this procedure.  An incision was made in a natural skin line above the umbilicus. The fascia was elevated and the Veress needle inserted. Proper position was confirmed by aspiration and saline meniscus test.  The abdomen was insufflated with carbon dioxide to a pressure of 15 mmHg. The patient tolerated insufflation well.  Abdominal cavity was entered using Optiview technique with a millimeter trocar.  No injury was identified.  Another 2 mm trocars were placed lateral to each rectus muscle.  Scissors and bipolar forceps were inserted under direct visualization. At the robotic console: Transverse peritoneal incision is made about 8 cm superior to both inguinal defects. Medial to the epigastric vessels, the parietal compartment is dissected to visualize the rectus muscle. This is carried down to the symphysis pubis and the retropubic  space is dissected to expose at least 2 cm contralateral to the midline. Cooper's ligament is exposed and cleared at least 2 cm below the ligament to ensure adequate space for the inferior border of the mesh. Hesselbach's triangle is cleared assessing for a direct hernia on the left groin. The hernia is reduced dissecting the contents away from the border of the transversalis (white) fascia.  On the right groin, lateral to the epigastric vessels, the dissection is carried out in visceral compartment continuing in the true preperitoneal plane. Indirect hernia sac, was carefully reduced and separated from the cord structures with medial retraction and a combination of blunt/sharp dissection and focused cautery. This dissection was continued until the cord structures are "parietalized" completely, allowing for visualization of the reflected peritoneum that is continuous with the line originating 2 cm below Coopers medially and across the psoas muscle in the lateral compartment.  The internal ring was interrogated for a cord lipoma. The cord lipoma was reduced to the retroperitoneum and seated dorsal to the preperitoneal mesh. Having achieved a complete dissection with a critical view of the entire myopectineal orifice on both sides, an XL mesh was then positioned centered at the iliopubic tract with the medial side crossing the midline and the inferior edge positioned 2 cm below Coopers ligament. The lateral aspect of the mesh extended 3-5 cm beyond the lateral edge of the psoas. The mesh is fixated using an interrupted suture placed to the ipsilateral Coopers ligament. A second suture was done at the medial superior aspect of the mesh fixating this to the rectus complex.  The peritoneal flap is closed with running barbed suture. Additional holes in the peritoneum closed with suture. Preperitoneal space gas aspirated to visualize the peritoneum apposed directly against the mesh and ensure  no folding, lifting, or  buckling of the mesh. Skin is closed, sterile dressings are applied.  The patient tolerated the procedure well and was taken to the postanesthesia care unit in stable condition  Specimen: None  Complications: None  Estimated Blood Loss: 5 mL

## 2022-10-29 NOTE — Progress Notes (Signed)
Up ambulating to bathroom. Able to void 150 mls. Pain minimal 2/10. Tolerating fluids. Voices readiness to be discharged.

## 2022-10-29 NOTE — Discharge Instructions (Addendum)
  Diet: Resume home heart healthy regular diet.   Activity: No heavy lifting >20 pounds (children, pets, laundry, garbage) or strenuous activity until follow-up, but light activity and walking are encouraged. Do not drive or drink alcohol if taking narcotic pain medications.  Wound care: May shower with soapy water and pat dry (do not rub incisions), but no baths or submerging incision underwater until follow-up. (no swimming)   Medications: Resume all home medications. For mild to moderate pain: acetaminophen (Tylenol) or ibuprofen (if no kidney disease). Combining Tylenol with alcohol can substantially increase your risk of causing liver disease. Narcotic pain medications, if prescribed, can be used for severe pain, though may cause nausea, constipation, and drowsiness. Do not combine Tylenol and Norco within a 6 hour period as Norco contains Tylenol. If you do not need the narcotic pain medication, you do not need to fill the prescription.  Call office ([phone]) at any time if any questions, worsening pain, fevers/chills, bleeding, drainage from incision site, or other concerns.   AMBULATORY SURGERY  DISCHARGE INSTRUCTIONS   The drugs that you were given will stay in your system until tomorrow so for the next 24 hours you should not:  Drive an automobile Make any legal decisions Drink any alcoholic beverage   You may resume regular meals tomorrow.  Today it is better to start with liquids and gradually work up to solid foods.  You may eat anything you prefer, but it is better to start with liquids, then soup and crackers, and gradually work up to solid foods.   Please notify your doctor immediately if you have any unusual bleeding, trouble breathing, redness and pain at the surgery site, drainage, fever, or pain not relieved by medication.    Additional Instructions:        Please contact your physician with any problems or Same Day Surgery at [phone], Monday  through Friday 6 am to 4 pm, or [facility] [facility] number at [phone]. 

## 2022-10-29 NOTE — Transfer of Care (Signed)
Immediate Anesthesia Transfer of Care Note  Patient: Ronnie Hanson  Procedure(s) Performed: XI ROBOTIC ASSISTED BILATERAL INGUINAL HERNIA (Bilateral: Groin) INSERTION OF MESH (Bilateral: Inguinal)  Patient Location: PACU  Anesthesia Type:General  Level of Consciousness: drowsy and patient cooperative  Airway & Oxygen Therapy: Patient Spontanous Breathing and Patient connected to nasal cannula oxygen  Post-op Assessment: Report given to RN, Post -op Vital signs reviewed and stable, and Patient moving all extremities X 4  Post vital signs: Reviewed and stable  Last Vitals:  Vitals Value Taken Time  BP 146/85 10/29/22 1345  Temp    Pulse 63 10/29/22 1348  Resp 11 10/29/22 1348  SpO2 100 % 10/29/22 1348  Vitals shown include unvalidated device data.  Last Pain:  Vitals:   10/29/22 0929  TempSrc: Oral  PainSc: 0-No pain      Patients Stated Pain Goal: 0 ([id number])  Complications: No notable events documented.

## 2022-10-30 ENCOUNTER — Encounter: Payer: Self-pay | Admitting: General Surgery

## 2022-10-31 NOTE — Anesthesia Postprocedure Evaluation (Signed)
Anesthesia Post Note  Patient: Ronnie Hanson  Procedure(s) Performed: XI ROBOTIC ASSISTED BILATERAL INGUINAL HERNIA (Abdomen) INSERTION OF MESH (Bilateral: Inguinal)  Patient location during evaluation: PACU Anesthesia Type: General Level of consciousness: awake Pain management: satisfactory to patient Vital Signs Assessment: post-procedure vital signs reviewed and stable Respiratory status: spontaneous breathing Cardiovascular status: stable Anesthetic complications: no   No notable events documented.   Last Vitals:  Vitals:   10/29/22 1421 10/29/22 1439  BP: (!) 150/88 (!) 151/83  Pulse: 66 67  Resp: 14 16  Temp: (!) 36.2 C (!) 36.2 C  SpO2: 98% 97%    Last Pain:  Vitals:   10/29/22 1439  TempSrc: Temporal  PainSc: 2                  VAN STAVEREN,Modean Mccullum

## 2023-02-04 DIAGNOSIS — E7800 Pure hypercholesterolemia, unspecified: Secondary | ICD-10-CM | POA: Diagnosis not present

## 2023-02-04 DIAGNOSIS — R7303 Prediabetes: Secondary | ICD-10-CM | POA: Diagnosis not present

## 2023-02-04 DIAGNOSIS — I25118 Atherosclerotic heart disease of native coronary artery with other forms of angina pectoris: Secondary | ICD-10-CM | POA: Diagnosis not present

## 2023-02-09 DIAGNOSIS — D2262 Melanocytic nevi of left upper limb, including shoulder: Secondary | ICD-10-CM | POA: Diagnosis not present

## 2023-02-09 DIAGNOSIS — D2261 Melanocytic nevi of right upper limb, including shoulder: Secondary | ICD-10-CM | POA: Diagnosis not present

## 2023-02-09 DIAGNOSIS — D2272 Melanocytic nevi of left lower limb, including hip: Secondary | ICD-10-CM | POA: Diagnosis not present

## 2023-02-09 DIAGNOSIS — Z85828 Personal history of other malignant neoplasm of skin: Secondary | ICD-10-CM | POA: Diagnosis not present

## 2023-02-09 DIAGNOSIS — L570 Actinic keratosis: Secondary | ICD-10-CM | POA: Diagnosis not present

## 2023-02-09 DIAGNOSIS — B078 Other viral warts: Secondary | ICD-10-CM | POA: Diagnosis not present

## 2023-02-09 DIAGNOSIS — L309 Dermatitis, unspecified: Secondary | ICD-10-CM | POA: Diagnosis not present

## 2023-02-09 DIAGNOSIS — D225 Melanocytic nevi of trunk: Secondary | ICD-10-CM | POA: Diagnosis not present

## 2023-02-11 DIAGNOSIS — Z125 Encounter for screening for malignant neoplasm of prostate: Secondary | ICD-10-CM | POA: Diagnosis not present

## 2023-02-11 DIAGNOSIS — R7303 Prediabetes: Secondary | ICD-10-CM | POA: Diagnosis not present

## 2023-02-11 DIAGNOSIS — I25118 Atherosclerotic heart disease of native coronary artery with other forms of angina pectoris: Secondary | ICD-10-CM | POA: Diagnosis not present

## 2023-02-11 DIAGNOSIS — I10 Essential (primary) hypertension: Secondary | ICD-10-CM | POA: Diagnosis not present

## 2023-04-08 DIAGNOSIS — Z9889 Other specified postprocedural states: Secondary | ICD-10-CM | POA: Diagnosis not present

## 2023-04-08 DIAGNOSIS — I25118 Atherosclerotic heart disease of native coronary artery with other forms of angina pectoris: Secondary | ICD-10-CM | POA: Diagnosis not present

## 2023-04-08 DIAGNOSIS — I10 Essential (primary) hypertension: Secondary | ICD-10-CM | POA: Diagnosis not present

## 2023-04-08 DIAGNOSIS — E7800 Pure hypercholesterolemia, unspecified: Secondary | ICD-10-CM | POA: Diagnosis not present

## 2023-05-12 DIAGNOSIS — H43813 Vitreous degeneration, bilateral: Secondary | ICD-10-CM | POA: Diagnosis not present

## 2023-05-12 DIAGNOSIS — H5203 Hypermetropia, bilateral: Secondary | ICD-10-CM | POA: Diagnosis not present

## 2023-05-12 DIAGNOSIS — H35033 Hypertensive retinopathy, bilateral: Secondary | ICD-10-CM | POA: Diagnosis not present

## 2023-05-12 DIAGNOSIS — H2513 Age-related nuclear cataract, bilateral: Secondary | ICD-10-CM | POA: Diagnosis not present

## 2023-05-12 DIAGNOSIS — G43109 Migraine with aura, not intractable, without status migrainosus: Secondary | ICD-10-CM | POA: Diagnosis not present

## 2023-05-12 DIAGNOSIS — H52223 Regular astigmatism, bilateral: Secondary | ICD-10-CM | POA: Diagnosis not present

## 2023-05-12 DIAGNOSIS — R7309 Other abnormal glucose: Secondary | ICD-10-CM | POA: Diagnosis not present

## 2023-05-12 DIAGNOSIS — H524 Presbyopia: Secondary | ICD-10-CM | POA: Diagnosis not present

## 2023-08-05 DIAGNOSIS — R7303 Prediabetes: Secondary | ICD-10-CM | POA: Diagnosis not present

## 2023-08-05 DIAGNOSIS — Z125 Encounter for screening for malignant neoplasm of prostate: Secondary | ICD-10-CM | POA: Diagnosis not present

## 2023-08-05 DIAGNOSIS — I10 Essential (primary) hypertension: Secondary | ICD-10-CM | POA: Diagnosis not present

## 2023-08-05 DIAGNOSIS — I25118 Atherosclerotic heart disease of native coronary artery with other forms of angina pectoris: Secondary | ICD-10-CM | POA: Diagnosis not present

## 2023-08-13 DIAGNOSIS — I25118 Atherosclerotic heart disease of native coronary artery with other forms of angina pectoris: Secondary | ICD-10-CM | POA: Diagnosis not present

## 2023-08-13 DIAGNOSIS — R7303 Prediabetes: Secondary | ICD-10-CM | POA: Diagnosis not present

## 2023-08-13 DIAGNOSIS — Z0000 Encounter for general adult medical examination without abnormal findings: Secondary | ICD-10-CM | POA: Diagnosis not present

## 2023-08-13 DIAGNOSIS — I10 Essential (primary) hypertension: Secondary | ICD-10-CM | POA: Diagnosis not present

## 2023-08-13 DIAGNOSIS — Z1331 Encounter for screening for depression: Secondary | ICD-10-CM | POA: Diagnosis not present

## 2023-09-29 DIAGNOSIS — M79675 Pain in left toe(s): Secondary | ICD-10-CM | POA: Diagnosis not present

## 2023-09-29 DIAGNOSIS — B351 Tinea unguium: Secondary | ICD-10-CM | POA: Diagnosis not present

## 2023-09-29 DIAGNOSIS — G609 Hereditary and idiopathic neuropathy, unspecified: Secondary | ICD-10-CM | POA: Diagnosis not present

## 2023-09-29 DIAGNOSIS — I87303 Chronic venous hypertension (idiopathic) without complications of bilateral lower extremity: Secondary | ICD-10-CM | POA: Diagnosis not present

## 2023-09-29 DIAGNOSIS — M79674 Pain in right toe(s): Secondary | ICD-10-CM | POA: Diagnosis not present

## 2023-09-29 DIAGNOSIS — R2689 Other abnormalities of gait and mobility: Secondary | ICD-10-CM | POA: Diagnosis not present

## 2023-10-12 DIAGNOSIS — E7800 Pure hypercholesterolemia, unspecified: Secondary | ICD-10-CM | POA: Diagnosis not present

## 2023-10-12 DIAGNOSIS — Z23 Encounter for immunization: Secondary | ICD-10-CM | POA: Diagnosis not present

## 2023-10-12 DIAGNOSIS — I10 Essential (primary) hypertension: Secondary | ICD-10-CM | POA: Diagnosis not present

## 2023-10-12 DIAGNOSIS — I25118 Atherosclerotic heart disease of native coronary artery with other forms of angina pectoris: Secondary | ICD-10-CM | POA: Diagnosis not present

## 2023-10-12 DIAGNOSIS — Z9889 Other specified postprocedural states: Secondary | ICD-10-CM | POA: Diagnosis not present

## 2024-01-12 DIAGNOSIS — H52223 Regular astigmatism, bilateral: Secondary | ICD-10-CM | POA: Diagnosis not present

## 2024-01-12 DIAGNOSIS — H43813 Vitreous degeneration, bilateral: Secondary | ICD-10-CM | POA: Diagnosis not present

## 2024-01-12 DIAGNOSIS — H5203 Hypermetropia, bilateral: Secondary | ICD-10-CM | POA: Diagnosis not present

## 2024-01-12 DIAGNOSIS — Z0100 Encounter for examination of eyes and vision without abnormal findings: Secondary | ICD-10-CM | POA: Diagnosis not present

## 2024-01-12 DIAGNOSIS — H2513 Age-related nuclear cataract, bilateral: Secondary | ICD-10-CM | POA: Diagnosis not present

## 2024-01-12 DIAGNOSIS — H35033 Hypertensive retinopathy, bilateral: Secondary | ICD-10-CM | POA: Diagnosis not present

## 2024-01-12 DIAGNOSIS — G43109 Migraine with aura, not intractable, without status migrainosus: Secondary | ICD-10-CM | POA: Diagnosis not present

## 2024-01-12 DIAGNOSIS — H524 Presbyopia: Secondary | ICD-10-CM | POA: Diagnosis not present

## 2024-01-12 DIAGNOSIS — R7309 Other abnormal glucose: Secondary | ICD-10-CM | POA: Diagnosis not present

## 2024-02-03 DIAGNOSIS — I25118 Atherosclerotic heart disease of native coronary artery with other forms of angina pectoris: Secondary | ICD-10-CM | POA: Diagnosis not present

## 2024-02-03 DIAGNOSIS — R7303 Prediabetes: Secondary | ICD-10-CM | POA: Diagnosis not present

## 2024-02-03 DIAGNOSIS — I10 Essential (primary) hypertension: Secondary | ICD-10-CM | POA: Diagnosis not present

## 2024-02-10 DIAGNOSIS — I10 Essential (primary) hypertension: Secondary | ICD-10-CM | POA: Diagnosis not present

## 2024-02-10 DIAGNOSIS — I2510 Atherosclerotic heart disease of native coronary artery without angina pectoris: Secondary | ICD-10-CM | POA: Diagnosis not present

## 2024-02-10 DIAGNOSIS — R7303 Prediabetes: Secondary | ICD-10-CM | POA: Diagnosis not present

## 2024-02-10 DIAGNOSIS — R29898 Other symptoms and signs involving the musculoskeletal system: Secondary | ICD-10-CM | POA: Diagnosis not present

## 2024-02-10 DIAGNOSIS — E7800 Pure hypercholesterolemia, unspecified: Secondary | ICD-10-CM | POA: Diagnosis not present

## 2024-02-10 DIAGNOSIS — Z1331 Encounter for screening for depression: Secondary | ICD-10-CM | POA: Diagnosis not present

## 2024-02-10 NOTE — Progress Notes (Signed)
 Ronnie Hanson is a 78 y.o. male here for follow up of their medical problems  CHIEF COMPLAINT:  Follow up medical problems in the problem list and as discussed in the history and assessment areas as well as new complaints as listed.   Patient Active Problem List  Diagnosis  . Migraine  . Hypercholesteremia  . Essential hypertension, benign  . Anxiety  . H/O cardiac catheterization  . 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 Healthcare maintenance  . Prediabetes     HISTORY OF PRESENT ILLNESS:  Prediabetes Glucose is followed and controlled on diet   Hypercholesteremia Healthy fat diet is being followed and no myalgia's are noted.   Essential hypertension, benign Taking medications without noted side effects or dizziness.    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    Past Medical History:  Diagnosis Date  . Anxiety    secondary to stress at work  . Colonic polyp   . Coronary artery disease   . Diverticulosis 07/12/2010  . Heart murmur   . Hypercholesteremia   . Hyperplastic colon polyp 04/27/1998  . Hypertension   . Migraine   . Tubular adenoma of colon 04/27/1998    Past Surgical History:  Procedure Laterality Date  . COLONOSCOPY  07/12/2010   05/30/2005, 06/12/2000, 04/27/1998  . COLONOSCOPY  09/07/2015   Tubular adenoma of colon/Repeat 51yrs/[facility]  . ROBOT ASSISTED LAPAROSCOPIC REPAIR INGUINAL HERNIA Bilateral 10/29/2022   Dr Lucas Catchings  . APPENDECTOMY    . HERNIA REPAIR    . TONSILLECTOMY       No fever chills or sweats   No nausea, vomiting or diarrhea  No chest pain, shortness of breath   Social History   Socioeconomic History  . Marital status: Married  Tobacco Use  . Smoking status: Never  . Smokeless tobacco: Never  Vaping Use  . Vaping status: Never Used  Substance  and Sexual Activity  . Alcohol use: Yes    Alcohol/week: 5.0 standard drinks of alcohol    Types: 5 Glasses of wine per week  . Drug use: Never  . Sexual activity: Not Currently    Partners: Female    Birth control/protection: None   Social Drivers of Health   Financial Resource Strain: Low Risk  (02/10/2024)   Overall Financial Resource Strain (CARDIA)   . Difficulty of Paying Living Expenses: Not hard at all  Food Insecurity: No Food Insecurity (02/10/2024)   Hunger Vital Sign   . Worried About Programme researcher, broadcasting/film/video in the Last Year: Never true   . Ran Out of Food in the Last Year: Never true  Transportation Needs: No Transportation Needs (02/10/2024)   PRAPARE - Transportation   . Lack of Transportation (Medical): No   . Lack of Transportation (Non-Medical): No  Housing Stability: Low Risk  (02/10/2024)   Housing Stability Vital Sign   . Unable to Pay for Housing in the Last Year: No   . Number of Times Moved in the Last Year: 0   . Homeless in the Last Year: No      Current Outpatient Medications:  .  amLODIPine (NORVASC) 2.5 MG tablet, TAKE 1 TABLET EVERY DAY, Disp: 90 tablet, Rfl: 2 .  aspirin 81 MG EC tablet, Take 81 mg by mouth once daily., Disp: , Rfl:  .  carvediloL (COREG) 25 MG  tablet, Take 1 tablet (25 mg total) by mouth 2 (two) times daily with meals, Disp: 180 tablet, Rfl: 3 .  multivit-min/FA/lycopen/lutein (CENTRUM SILVER MEN ORAL), Take by mouth, Disp: , Rfl:  .  psyllium (METAMUCIL) 0.52 gram capsule, Take 0.52 g by mouth once daily, Disp: , Rfl:  .  sertraline (ZOLOFT) 50 MG tablet, TAKE 1 TABLET ONE TIME DAILY, Disp: 90 tablet, Rfl: 3 .  simvastatin (ZOCOR) 20 MG tablet, TAKE 1 TABLET ONE TIME DAILY, Disp: 90 tablet, Rfl: 3 .  traZODone (DESYREL) 50 MG tablet, TAKE 1 TABLET (50 MG TOTAL) BY MOUTH AT BEDTIME, Disp: 90 tablet, Rfl: 3  Vitals:   02/10/24 1406  BP: 119/70  Pulse: 68   Body mass index is 22.8 kg/m. No acute distress Lungs; clear to  ascultation Heart; Regular rate and rhythm  Abdomen; Soft and flat, normal bowel sounds Extremities; No clubbing, cyanosis or edema  Appointment on 02/03/2024  Component Date Value Ref Range Status  . Glucose 02/03/2024 91  70 - 110 mg/dL Final  . Sodium 93/88/7974 144  136 - 145 mmol/L Final  . Potassium 02/03/2024 4.4  3.6 - 5.1 mmol/L Final  . Chloride 02/03/2024 106  97 - 109 mmol/L Final  . Carbon Dioxide (CO2) 02/03/2024 33.3 (H)  22.0 - 32.0 mmol/L Final  . Urea Nitrogen (BUN) 02/03/2024 19  7 - 25 mg/dL Final  . Creatinine 93/88/7974 1.0  0.7 - 1.3 mg/dL Final  . Glomerular Filtration Rate (eGFR) 02/03/2024 77  >60 mL/min/1.73sq m Final  . Calcium 02/03/2024 9.6  8.7 - 10.3 mg/dL Final  . AST  93/88/7974 17  8 - 39 U/L Final  . ALT  02/03/2024 15  6 - 57 U/L Final  . Alk Phos (alkaline Phosphatase) 02/03/2024 65  34 - 104 U/L Final  . Albumin 02/03/2024 4.6  3.5 - 4.8 g/dL Final  . Bilirubin, Total 02/03/2024 0.6  0.3 - 1.2 mg/dL Final  . Protein, Total 02/03/2024 7.1  6.1 - 7.9 g/dL Final  . A/G Ratio 93/88/7974 1.8  1.0 - 5.0 gm/dL Final  . Hemoglobin J8R 02/03/2024 5.8 (H)  4.2 - 5.6 % Final  . Average Blood Glucose (Calc) 02/03/2024 120  mg/dL Final  . Cholesterol, Total 02/03/2024 146  100 - 200 mg/dL Final  . Triglyceride 93/88/7974 56  35 - 199 mg/dL Final  . HDL (High Density Lipoprotein) Cho* 02/03/2024 58.9  29.0 - 71.0 mg/dL Final  . LDL Calculated 02/03/2024 76  0 - 130 mg/dL Final  . VLDL Cholesterol 02/03/2024 11  mg/dL Final  . Cholesterol/HDL Ratio 02/03/2024 2.5   Final  . Creatinine, Random Urine 02/03/2024 141.5  40.0 - 300.0 mg/dL Final  . Urine Albumin, Random 02/03/2024 8    mg/L Final  . Urine Albumin/Creatinine Ratio 02/03/2024 5.7  <30.0 ug/mg Final    ASSESSMENT  AND PLAN:  Diagnoses and all orders for this visit:  Coronary artery disease of native artery of native heart with stable angina pectoris () Assessment & Plan: Exertional chest pain  is not present and patient is tolerating the prescribed medical regimen.    Orders: -     Comprehensive Metabolic Panel (CMP); Future -     Hemoglobin A1C; Future -     Lipid Panel w/calc LDL; Future -     Thyroid Stimulating-Hormone (TSH); Future  Essential hypertension, benign Assessment & Plan: Taking medications without noted side effects or dizziness.     Hypercholesteremia Assessment & Plan: Healthy  fat diet is being followed and no myalgia's are noted.   Orders: -     Comprehensive Metabolic Panel (CMP); Future -     Hemoglobin A1C; Future -     Lipid Panel w/calc LDL; Future -     Thyroid Stimulating-Hormone (TSH); Future  Prediabetes Assessment & Plan: Glucose is followed and controlled on diet   Orders: -     Comprehensive Metabolic Panel (CMP); Future -     Hemoglobin A1C; Future -     Lipid Panel w/calc LDL; Future -     Thyroid Stimulating-Hormone (TSH); Future  Depression screening (Z13.31) -     Depression Screen -(PHQ- 2/9, BDI)  Weakness of both legs -     Ambulatory Referral to Physical Therapy -     Ambulatory Referral to Neurology

## 2024-02-15 DIAGNOSIS — L821 Other seborrheic keratosis: Secondary | ICD-10-CM | POA: Diagnosis not present

## 2024-02-15 DIAGNOSIS — D2262 Melanocytic nevi of left upper limb, including shoulder: Secondary | ICD-10-CM | POA: Diagnosis not present

## 2024-02-15 DIAGNOSIS — D2272 Melanocytic nevi of left lower limb, including hip: Secondary | ICD-10-CM | POA: Diagnosis not present

## 2024-02-15 DIAGNOSIS — Z08 Encounter for follow-up examination after completed treatment for malignant neoplasm: Secondary | ICD-10-CM | POA: Diagnosis not present

## 2024-02-15 DIAGNOSIS — Z85828 Personal history of other malignant neoplasm of skin: Secondary | ICD-10-CM | POA: Diagnosis not present

## 2024-02-15 DIAGNOSIS — D225 Melanocytic nevi of trunk: Secondary | ICD-10-CM | POA: Diagnosis not present

## 2024-02-15 DIAGNOSIS — D2261 Melanocytic nevi of right upper limb, including shoulder: Secondary | ICD-10-CM | POA: Diagnosis not present

## 2024-02-15 DIAGNOSIS — B078 Other viral warts: Secondary | ICD-10-CM | POA: Diagnosis not present

## 2024-02-15 DIAGNOSIS — D2271 Melanocytic nevi of right lower limb, including hip: Secondary | ICD-10-CM | POA: Diagnosis not present

## 2024-02-16 DIAGNOSIS — R2689 Other abnormalities of gait and mobility: Secondary | ICD-10-CM | POA: Diagnosis not present

## 2024-02-16 DIAGNOSIS — R296 Repeated falls: Secondary | ICD-10-CM | POA: Diagnosis not present

## 2024-02-16 DIAGNOSIS — G20A1 Parkinson's disease without dyskinesia, without mention of fluctuations: Secondary | ICD-10-CM | POA: Diagnosis not present

## 2024-02-16 DIAGNOSIS — R498 Other voice and resonance disorders: Secondary | ICD-10-CM | POA: Diagnosis not present

## 2024-02-16 DIAGNOSIS — R29898 Other symptoms and signs involving the musculoskeletal system: Secondary | ICD-10-CM | POA: Diagnosis not present

## 2024-03-08 DIAGNOSIS — R29898 Other symptoms and signs involving the musculoskeletal system: Secondary | ICD-10-CM | POA: Diagnosis not present

## 2024-03-16 DIAGNOSIS — R29898 Other symptoms and signs involving the musculoskeletal system: Secondary | ICD-10-CM | POA: Diagnosis not present

## 2024-03-23 DIAGNOSIS — R269 Unspecified abnormalities of gait and mobility: Secondary | ICD-10-CM | POA: Diagnosis not present

## 2024-03-23 DIAGNOSIS — R29898 Other symptoms and signs involving the musculoskeletal system: Secondary | ICD-10-CM | POA: Diagnosis not present

## 2024-03-23 DIAGNOSIS — R293 Abnormal posture: Secondary | ICD-10-CM | POA: Diagnosis not present

## 2024-03-23 DIAGNOSIS — R2689 Other abnormalities of gait and mobility: Secondary | ICD-10-CM | POA: Diagnosis not present

## 2024-03-30 DIAGNOSIS — R2689 Other abnormalities of gait and mobility: Secondary | ICD-10-CM | POA: Diagnosis not present

## 2024-03-30 DIAGNOSIS — R293 Abnormal posture: Secondary | ICD-10-CM | POA: Diagnosis not present

## 2024-03-30 DIAGNOSIS — R29898 Other symptoms and signs involving the musculoskeletal system: Secondary | ICD-10-CM | POA: Diagnosis not present

## 2024-03-30 DIAGNOSIS — R269 Unspecified abnormalities of gait and mobility: Secondary | ICD-10-CM | POA: Diagnosis not present

## 2024-04-06 DIAGNOSIS — R29898 Other symptoms and signs involving the musculoskeletal system: Secondary | ICD-10-CM | POA: Diagnosis not present

## 2024-04-11 DIAGNOSIS — E7800 Pure hypercholesterolemia, unspecified: Secondary | ICD-10-CM | POA: Diagnosis not present

## 2024-04-11 DIAGNOSIS — I25118 Atherosclerotic heart disease of native coronary artery with other forms of angina pectoris: Secondary | ICD-10-CM | POA: Diagnosis not present

## 2024-04-11 DIAGNOSIS — Z9889 Other specified postprocedural states: Secondary | ICD-10-CM | POA: Diagnosis not present

## 2024-04-11 DIAGNOSIS — I10 Essential (primary) hypertension: Secondary | ICD-10-CM | POA: Diagnosis not present

## 2024-04-11 NOTE — Progress Notes (Signed)
 Established Patient Visit   Chief Complaint: Chief Complaint  Patient presents with  . Coronary Artery Disease  . Hypertension   Date of Service: 04/11/2024 Date of Birth: 15-Oct-1945 PCP: Ronnie Hanson DOUGLAS, MD  History of Present Illness: Ronnie Hanson is a 78 y.o.male patient who returns for   1.  50% proximal, 60% mid LAD by cardiac cath 03/13/2015  2.  Chronic stable angina  3.  Essential hypertension  4.  Hyperlipidemia  The patient returns for follow-up, reports doing good.  He denies exertional chest pain or shortness of breath.  He reports occasional palpitations.  He reports mild peripheral edema. He denies presyncope or syncope.  The patient is active, walks 15 minutes 3 times weekly, and does yard work.  The patient underwent ETT Myoview on 02/21/2022, exercising for 8 minutes and 28 seconds on a Bruce protocol.  He experienced chest pressure towards the end of the stress component.  Maximum workload was 11.6 METS.  ECG during stress was normal.  Gated scintigraphy revealed LVEF 62%.  SPECT imaging revealed mild inferior scar without significant ischemia.   The patient has essential hypertension, blood pressure well controlled on carvedilol and low dose amlodipine which are well-tolerated without apparent side effects. The patient follows a low-sodium, no added salt diet.  The patient has hyperlipidemia; LDL cholesterol was 71 on 3/87/7975, currently on simvastatin, which is well tolerated without apparent side effects, followed by his primary care provider.    Past Medical and Surgical History  Past Medical History Past Medical History:  Diagnosis Date  . Anxiety    secondary to stress at work  . Colonic polyp   . Coronary artery disease   . Diverticulosis 07/12/2010  . Heart murmur   . Hypercholesteremia   . Hyperplastic colon polyp 04/27/1998  . Hypertension   . Migraine   . Tubular adenoma of colon 04/27/1998    Past Surgical History He has a past  surgical history that includes Appendectomy; Tonsillectomy; Colonoscopy (07/12/2010); Colonoscopy (09/07/2015); robot assisted laparoscopic repair inguinal hernia (Bilateral, 10/29/2022); and Hernia repair.   Medications and Allergies  Current Medications  Current Outpatient Medications  Medication Sig Dispense Refill  . amLODIPine (NORVASC) 2.5 MG tablet TAKE 1 TABLET EVERY DAY 90 tablet 2  . aspirin 81 MG EC tablet Take 81 mg by mouth once daily.    . carbidopa-levodopa (SINEMET) 25-100 mg tablet Take 1 tablet by mouth 3 (three) times daily 90 tablet 2  . carvediloL (COREG) 25 MG tablet Take 1 tablet (25 mg total) by mouth 2 (two) times daily with meals 180 tablet 3  . multivit-min/FA/lycopen/lutein (CENTRUM SILVER MEN ORAL) Take by mouth    . psyllium (METAMUCIL) 0.52 gram capsule Take 0.52 g by mouth once daily    . sertraline (ZOLOFT) 50 MG tablet TAKE 1 TABLET ONE TIME DAILY 90 tablet 3  . simvastatin (ZOCOR) 20 MG tablet TAKE 1 TABLET EVERY DAY 90 tablet 3  . traZODone (DESYREL) 50 MG tablet TAKE 1 TABLET AT BEDTIME 90 tablet 3   No current facility-administered medications for this visit.    Allergies: Sulfa (sulfonamide antibiotics) and Chloraprep clear [chlorhexidin-isopropyl alcohol]  Social and Family History  Social History  reports that he has never smoked. He has never used smokeless tobacco. He reports current alcohol use of about 5.0 standard drinks of alcohol per week. He reports that he does not use drugs.  Family History Family History  Problem Relation Name Age of Onset  . Colon  cancer Mother Ronnie Hanson   . Alzheimer's disease Mother Ronnie Hanson   . Ovarian cancer Mother Ronnie Hanson   . Parkinsonism Father Ronnie Hanson   . High blood pressure (Hypertension) Father Ronnie Hanson     Review of Systems   Review of Systems: The patient reports occasional episodes of exertional chest pain, without shortness of breath, orthopnea, paroxysmal nocturnal dyspnea,  with chronic pedal edema, without palpitations, heart racing, presyncope, syncope, with sleep disturbance, with anxiety. Review of 8 Systems is negative except as described above.  Physical Examination   Vitals: Pulse 63   Ht 170.2 cm (5' 7)   Wt 63.5 kg (140 lb)   SpO2 96%   BMI 21.93 kg/m  Ht:170.2 cm (5' 7) Wt:63.5 kg (140 lb) ADJ:Anib surface area is 1.73 meters squared. Body mass index is 21.93 kg/m.  General: Alert and oriented. Well-appearing. No acute distress. HEENT: Pupils equally reactive to light and accomodation    Neck: Supple, no JVD Lungs: Normal effort of breathing; clear to auscultation bilaterally; no wheezes, rales, rhonchi Heart: Regular rate and rhythm. No murmur, rub, or gallop Abdomen: nondistended, with normal bowel sounds Extremities: with mild bilateral ankle edema Peripheral Pulses: 2+ radial  Skin: Warm, dry, no diaphoresis  Assessment   78 y.o. male with  1. Coronary artery disease of native artery of native heart with stable angina pectoris ()   2. Essential hypertension, benign   3. H/O cardiac catheterization   4. Hypercholesteremia    78 year old gentleman with nonobstructive coronary artery disease by cardiac catheterization, with a several year history of chronic stable angina, currently chest pain-free.  ETT Myoview 02/21/2022 revealed mild inferior scar without ischemia.  The patient has essential hypertension, blood pressure well controlled on carvedilol and amlodipine.  The patient has hyperlipidemia with good control of LDL cholesterol on simvastatin.   Plan   1.  Continue current medications 2.  Counseled patient about low-sodium diet 3.  DASH diet printed instructions given to the patient 4.  Counseled patient about low-cholesterol diet 5.  Continue simvastatin for hyperlipidemia management 6.  Low-fat and cholesterol diet printed instructions given to the patient 7.  Return to clinic for follow-up in 6 months   No orders of  the defined types were placed in this encounter.   Return in about 6 months (around 10/12/2024).   MARSA DOOMS, MD PhD [facility]

## 2024-04-13 DIAGNOSIS — R29898 Other symptoms and signs involving the musculoskeletal system: Secondary | ICD-10-CM | POA: Diagnosis not present

## 2024-04-18 DIAGNOSIS — R29898 Other symptoms and signs involving the musculoskeletal system: Secondary | ICD-10-CM | POA: Diagnosis not present

## 2024-04-18 DIAGNOSIS — R2689 Other abnormalities of gait and mobility: Secondary | ICD-10-CM | POA: Diagnosis not present

## 2024-04-18 DIAGNOSIS — R296 Repeated falls: Secondary | ICD-10-CM | POA: Diagnosis not present

## 2024-04-18 DIAGNOSIS — G20A1 Parkinson's disease without dyskinesia, without mention of fluctuations: Secondary | ICD-10-CM | POA: Diagnosis not present

## 2024-04-18 DIAGNOSIS — R498 Other voice and resonance disorders: Secondary | ICD-10-CM | POA: Diagnosis not present

## 2024-04-20 DIAGNOSIS — R29898 Other symptoms and signs involving the musculoskeletal system: Secondary | ICD-10-CM | POA: Diagnosis not present

## 2024-04-20 DIAGNOSIS — R293 Abnormal posture: Secondary | ICD-10-CM | POA: Diagnosis not present

## 2024-04-20 DIAGNOSIS — R269 Unspecified abnormalities of gait and mobility: Secondary | ICD-10-CM | POA: Diagnosis not present

## 2024-04-20 DIAGNOSIS — R2689 Other abnormalities of gait and mobility: Secondary | ICD-10-CM | POA: Diagnosis not present

## 2024-04-20 NOTE — Progress Notes (Signed)
 [facility]                    PT Daily Progress Note  Patient's Name:Ronnie Hanson                        MRN #[id number]      Date:04/20/2024                        Visit Number: 7                                                                      Progress Related to Long and Short Term Goals:   []  ROM: []  Strength Pain -    while- [] at rest    [] During activity:  []  Functional goal: []  Other: []  Balance:  Patient / Family Education:    []  Progressed HEP to include:  []  Reviewed previous HEP: ______________________________________________________________________  Skilled Service Provided:  To: []  right   []  left     [x]  bilateral    [x]   core/body stability and balance  []  neck   []   shoulder   []  upper arm/  []  elbow   []  lower arm  []  wrist   []  hand   []   fingers  []  back     [x]  hip    []  upper leg    [x]  knee  [x]  lower leg   [x]   ankle  foot       Ther Ex Therapeutic Procedure CPT 97110 : 40 minutes   Manual Tx      Ice / Heat             E-Stim                 Infrared     Vaso-pneumatic compression      [x]  Inc ROM [] Inc joint mob [] Dec pain [] Dec inflam Setting:  [] Dec swelling  [x]  Inc Flex []  Inc Flex [] Dec inflam [] Dec pain [] Inc Healing [] Dec edema  [x]  Inc  Bal []  Inc ROM [] Dec swelling [] Dec swelling [] Dec pain Level      []  1        []  2      []  3  [x]  Inc Strength [] Astym [] Inc circulation [] Inc circulation [] Inc circulation Temp:         F  Progressive  HEP          []   [] tension/ spasms/soft tissue mob [] Inc soft tissue mobility [] Inc tissue mob    [x] Inc Posture [] Correct malaign  Settings:      Subjective:  Patient reports he is doing okay today. He has noticed that walking up hill is a little better.  Treatment notes:     The patient tolerated all exercises well Assessment of Treatment:   Continue to progress overall strength, balance, sit-stand and speed of movement Patient's Response to Treatment:  []   pain  []  pain    []   flexibility      [x]  endurance      [x]    strength     []   AROM      []   PROM       []   Spasms    []  Posture / alignment    []   Joint Mobility  []  Other:   Functional Improvement Noted:  Increased ability to  []  walk   []  stand for longer periods   []  dress with less help    []  lift     []   reach     []   bend    []  Other:  Remaining Impairment Requiring Continued Treatment:   [x]   flexibility   [x]   ROM   [x]   pain  [x]  strength   [x]   weakness    [x]   balance   []   swelling   []   inflammation        []  Spasm  [x]  posture/alignment    []   Other:  Plan: [x]   Continue Plan of Care        []   Add:           []   Discharge after next visit    []   Discharge to HEP   []  Other:   PT Billing Documentation Visit Number: 7       Frequently Used Timed Codes Therapeutic Procedure CPT 97110 : 40 minutes                    Total Treatment Time: 40 minutes    Randine LITTIE Muss, PT     Back Treatment Log   Date 7/15 7/23 7/31 8/5 8/13 8/20 8/27     Bike/Stepper (seat_____)  S L2 10' S2 10 S3.5 10 L3.5 10' L3.5 10' L3.5 10     Treadmill            Leg Press  Mini 2x10 Mini 3x10 3x10 2x10 2x10 44 3x10     Heel raises 10x 2x10 3x10 3x10 2x10 2x10 3x10     Multi-Hip Flex  Stand 2x10 3x10 30 2x10 30 2x10 30 2x10 30 3x10     Multi-Hip Ext 2x10 Stand 2x10 3x10 30 2x10 30 2x10 30 2x10 30 3x10     Multi-Hip Abd 2x10 Stand 2x10 3x10 30 2x10 30 2x10 30 2x10 30 3x10     Multi-Hip Add            Knee Extension    35 2x10 35 2x10 35 2x10 35 3x10     Leg Curls    35 2x10 30 2x10 30 2x10 30 3x10     Standing shoulder flex            Standing shoulder abd            SLR 2x10   2x10        Marching            Bridges            Abdmonial Isometrics w/ball            Rebounder    Yellow 30x         Wall target   4x10         Prone Prop/ Prone Push Up            SKC/Piriformis/HAMS/QUADS            Sit-stand 10x           LAQ  2x10 2x10 2x10 2x10 2x10 2x10     Seated march   2x10 2x10 2x10 2x10 2x10      LTR  2x10 2x10 2x10 2x10 2x10      Ball  sq  2x10 2x10 2x10 2x10 2x10      Supine clam  RTB 2x10 GTB 3x10 GTB 2x10 GTB 2x10 GTB 3x10      Sit-stand low surface    10x x10 x10      Step ups     8 x10 8 2x10 8 3x10                 Ultrasound/ Infrared            HP/CP with/ without  IFC

## 2024-04-27 DIAGNOSIS — R29898 Other symptoms and signs involving the musculoskeletal system: Secondary | ICD-10-CM | POA: Diagnosis not present

## 2024-04-27 NOTE — Progress Notes (Signed)
 [facility]                    PT Daily Progress Note  Patient's Name:Ronnie Hanson                        MRN #[id number]      Date:04/27/2024                        Visit Number: 8                                                                      Progress Related to Long and Short Term Goals:   []  ROM: []  Strength Pain -    while- [] at rest    [] During activity:  []  Functional goal: []  Other: []  Balance:  Patient / Family Education:    []  Progressed HEP to include:  []  Reviewed previous HEP: ______________________________________________________________________  Skilled Service Provided:  To: []  right   []  left     [x]  bilateral    [x]   core/body stability and balance  []  neck   []   shoulder   []  upper arm/  []  elbow   []  lower arm  []  wrist   []  hand   []   fingers  []  back     [x]  hip    []  upper leg    [x]  knee  [x]  lower leg   [x]   ankle  foot       Ther Ex Therapeutic Procedure CPT 97110 : 40 minutes   Manual Tx      Ice / Heat             E-Stim                 Infrared     Vaso-pneumatic compression      [x]  Inc ROM [] Inc joint mob [] Dec pain [] Dec inflam Setting:  [] Dec swelling  [x]  Inc Flex []  Inc Flex [] Dec inflam [] Dec pain [] Inc Healing [] Dec edema  [x]  Inc  Bal []  Inc ROM [] Dec swelling [] Dec swelling [] Dec pain Level      []  1        []  2      []  3  [x]  Inc Strength [] Astym [] Inc circulation [] Inc circulation [] Inc circulation Temp:         F  Progressive  HEP          []   [] tension/ spasms/soft tissue mob [] Inc soft tissue mobility [] Inc tissue mob    [x] Inc Posture [] Correct malaign  Settings:      Subjective:   No new complaints today.  Treatment notes:    Assessment of Treatment:   Good effort with all therex today. Mercer toss performed with feet together and tandem with yellow ball today , 10x each direction. Sit to stand performed from 14 steps. Patient's Response to Treatment:  []  pain  []  pain    []    flexibility      [x]  endurance      [x]    strength     []   AROM      []   PROM       []   Spasms    []  Posture / alignment    []   Joint Mobility  []  Other:   Functional Improvement Noted:  Increased ability to  []  walk   []  stand for longer periods   []  dress with less help    []  lift     []   reach     []   bend    []  Other:  Remaining Impairment Requiring Continued Treatment:   [x]   flexibility   [x]   ROM   [x]   pain  [x]  strength   [x]   weakness    [x]   balance   []   swelling   []   inflammation        []  Spasm  [x]  posture/alignment    []   Other:  Plan: [x]   Continue Plan of Care        []   Add:           []   Discharge after next visit    []   Discharge to HEP   []  Other:   PT Billing Documentation Visit Number: 8       Frequently Used Timed Codes Therapeutic Procedure CPT 97110 : 40 minutes                    Total Treatment Time: 40 minutes    Attestation Statement:   I personally performed the service, incident to.  DELMER)   Rollene Bellman, PTA    Back Treatment Log   Date 8/20 8/27 9/3    Bike/Stepper (seat_____) L3.5 10' L3.5 10 L3.5 10'    Treadmill       Leg Press 2x10 44 3x10 44 3x10    Heel raises 2x10 3x10 3x10    Multi-Hip Flex 30 2x10 30 3x10 30 3x10    Multi-Hip Ext 30 2x10 30 3x10 30 3x10    Multi-Hip Abd 30 2x10 30 3x10 30 3x10    Multi-Hip Add       Knee Extension 35 2x10 35 3x10 25 3x10    Leg Curls 30 2x10 30 3x10 30 3x10    Standing shoulder flex       Standing shoulder abd       SLR       Marching       Bridges       Abdmonial Isometrics w/ball       Rebounder    Yellow 3 way x10 ea    Wall target       Prone Prop/ Prone Push Up       SKC/Piriformis/HAMS/QUADS       Sit-stand       LAQ 2x10 2x10     Seated march 2x10  3x10    LTR 2x10  3x10    Ball sq 2x10  3x10    Supine clam GTB 3x10  GTB 3x10    Sit-stand low surface x10  x10    Step ups 8 2x10 8 3x10 8 3x10 ea           Ultrasound/ Infrared       HP/CP with/ without  IFC

## 2024-05-06 DIAGNOSIS — R2689 Other abnormalities of gait and mobility: Secondary | ICD-10-CM | POA: Diagnosis not present

## 2024-05-06 DIAGNOSIS — R293 Abnormal posture: Secondary | ICD-10-CM | POA: Diagnosis not present

## 2024-05-06 DIAGNOSIS — R29898 Other symptoms and signs involving the musculoskeletal system: Secondary | ICD-10-CM | POA: Diagnosis not present

## 2024-05-06 DIAGNOSIS — R269 Unspecified abnormalities of gait and mobility: Secondary | ICD-10-CM | POA: Diagnosis not present

## 2024-05-13 DIAGNOSIS — R29898 Other symptoms and signs involving the musculoskeletal system: Secondary | ICD-10-CM | POA: Diagnosis not present

## 2024-05-13 NOTE — Progress Notes (Signed)
 [facility]                    PT Daily Progress Note  Patient's Name:Ronnie Hanson                        MRN #[id number]      Date:05/13/2024                        Visit Number: 10                                                                      Progress Related to Long and Short Term Goals:   []  ROM: []  Strength Pain -    while- [] at rest    [] During activity:  []  Functional goal: []  Other: []  Balance:  Patient / Family Education:    []  Progressed HEP to include:  []  Reviewed previous HEP: ______________________________________________________________________  Skilled Service Provided:  To: []  right   []  left     [x]  bilateral    [x]   core/body stability and balance  []  neck   []   shoulder   []  upper arm/  []  elbow   []  lower arm  []  wrist   []  hand   []   fingers  []  back     [x]  hip    []  upper leg    [x]  knee  [x]  lower leg   [x]   ankle  foot       Ther Ex Therapeutic Procedure CPT 97110 : 40 minutes   Manual Tx      Ice / Heat             E-Stim                 Infrared     Vaso-pneumatic compression      [x]  Inc ROM [] Inc joint mob [] Dec pain [] Dec inflam Setting:  [] Dec swelling  [x]  Inc Flex []  Inc Flex [] Dec inflam [] Dec pain [] Inc Healing [] Dec edema  [x]  Inc  Bal []  Inc ROM [] Dec swelling [] Dec swelling [] Dec pain Level      []  1        []  2      []  3  [x]  Inc Strength [] Astym [] Inc circulation [] Inc circulation [] Inc circulation Temp:         F  Progressive  HEP          []   [] tension/ spasms/soft tissue mob [] Inc soft tissue mobility [] Inc tissue mob    [x] Inc Posture [] Correct malaign  Settings:      Subjective:   No complaints today.   Treatment notes:      Assessment of Treatment:  The patient completed therapeutic exercises without difficulty per flowsheet  Patient's Response to Treatment:  []  pain  []  pain    []   flexibility      [x]  endurance      [x]    strength     []   AROM      []   PROM       []   Spasms     []  Posture / alignment    []   Joint Mobility  []   Other:   Functional Improvement Noted:  Increased ability to  []  walk   []  stand for longer periods   []  dress with less help    []  lift     []   reach     []   bend    []  Other:  Remaining Impairment Requiring Continued Treatment:   [x]   flexibility   [x]   ROM   [x]   pain  [x]  strength   [x]   weakness    [x]   balance   []   swelling   []   inflammation        []  Spasm  [x]  posture/alignment    []   Other:  Plan: [x]   Continue Plan of Care        []   Add:           []   Discharge after next visit    []   Discharge to HEP   []  Other:   PT Billing Documentation Visit Number: 10       Frequently Used Timed Codes Therapeutic Procedure CPT 97110 : 40 minutes                    Total Treatment Time: 40 minute   Attestation Statement:   I personally performed the service, incident to.  DELMER)   Rollene Bellman, PTA   Back Treatment Log   Date 8/20 8/27 9/3 9/12 9/19  Bike/Stepper (seat_____) L3.5 10' L3.5 10 L3.5 10' L3.5 10 L3.5 10'  Treadmill       Leg Press 2x10 44 3x10 44 3x10 44 3x10 44 3x10  Heel raises 2x10 3x10 3x10 3x10 3x10  Multi-Hip Flex 30 2x10 30 3x10 30 3x10 30 3x10 35 3x10  Multi-Hip Ext 30 2x10 30 3x10 30 3x10 30 3x10 35 3x10  Multi-Hip Abd 30 2x10 30 3x10 30 3x10 30 3x10 35 3x10  Multi-Hip Add       Knee Extension 35 2x10 35 3x10 25 3x10 25 3x10 25 3x10  Leg Curls 30 2x10 30 3x10 30 3x10 30 3x10 30 3x10  Standing shoulder flex       Standing shoulder abd       SLR       Marching       Bridges       Abdmonial Isometrics w/ball       Rebounder    Yellow 3 way x10 ea Yellow 3x10 Yellow 3x10  Wall target       Prone Prop/ Prone Push Up       SKC/Piriformis/HAMS/QUADS       Sit-stand       LAQ 2x10 2x10     Seated march 2x10  3x10 3x10 3x10  LTR 2x10  3x10 3x10 3x10  Ball sq 2x10  3x10 3x10 3x10  Supine clam GTB 3x10  GTB 3x10 GTB 3x10 GTB 3x10  Sit-stand low surface x10  x10 x10 x10  Step ups 8 2x10  8 3x10 8 3x10 ea 8 3x10 8 3x10         Ultrasound/ Infrared       HP/CP with/ without  IFC

## 2024-05-18 DIAGNOSIS — R29898 Other symptoms and signs involving the musculoskeletal system: Secondary | ICD-10-CM | POA: Diagnosis not present

## 2024-05-18 NOTE — Progress Notes (Signed)
 [facility]                    PT Daily Progress Note  Patient's Name:Ronnie Hanson                        MRN #[id number]      Date:05/18/2024                        Visit Number: 11                                                                      Progress Related to Long and Short Term Goals:   []  ROM: []  Strength Pain -    while- [] at rest    [] During activity:  []  Functional goal: []  Other: []  Balance:  Patient / Family Education:    []  Progressed HEP to include:  []  Reviewed previous HEP: ______________________________________________________________________  Skilled Service Provided:  To: []  right   []  left     [x]  bilateral    [x]   core/body stability and balance  []  neck   []   shoulder   []  upper arm/  []  elbow   []  lower arm  []  wrist   []  hand   []   fingers  []  back     [x]  hip    []  upper leg    [x]  knee  [x]  lower leg   [x]   ankle  foot       Ther Ex Therapeutic Procedure CPT 97110 : 40 minutes   Manual Tx      Ice / Heat             E-Stim                 Infrared     Vaso-pneumatic compression      [x]  Inc ROM [] Inc joint mob [] Dec pain [] Dec inflam Setting:  [] Dec swelling  [x]  Inc Flex []  Inc Flex [] Dec inflam [] Dec pain [] Inc Healing [] Dec edema  [x]  Inc  Bal []  Inc ROM [] Dec swelling [] Dec swelling [] Dec pain Level      []  1        []  2      []  3  [x]  Inc Strength [] Astym [] Inc circulation [] Inc circulation [] Inc circulation Temp:         F  Progressive  HEP          []   [] tension/ spasms/soft tissue mob [] Inc soft tissue mobility [] Inc tissue mob    [x] Inc Posture [] Correct malaign  Settings:      Subjective:   No complaints today.   Treatment notes:      Assessment of Treatment:  Good effort with all therex today.   Patient's Response to Treatment:  []  pain  []  pain    []   flexibility      [x]  endurance      [x]    strength     []   AROM      []   PROM       []   Spasms    []  Posture / alignment    []   Joint  Mobility  []   Other:   Functional Improvement Noted:  Increased ability to  []  walk   []  stand for longer periods   []  dress with less help    []  lift     []   reach     []   bend    []  Other:  Remaining Impairment Requiring Continued Treatment:   [x]   flexibility   [x]   ROM   [x]   pain  [x]  strength   [x]   weakness    [x]   balance   []   swelling   []   inflammation        []  Spasm  [x]  posture/alignment    []   Other:  Plan: [x]   Continue Plan of Care        []   Add:           []   Discharge after next visit    []   Discharge to HEP   []  Other:   PT Billing Documentation Visit Number: 11       Frequently Used Timed Codes Therapeutic Procedure CPT 97110 : 40 minutes                    Total Treatment Time: 40 minute   Attestation Statement:   I personally performed the service, incident to.  DELMER)   Rollene Bellman, PTA   Back Treatment Log   Date 8/20 8/27 9/3 9/12 9/19 9/24  Bike/Stepper (seat_____) L3.5 10' L3.5 10 L3.5 10' L3.5 10 L3.5 10' L3.5 10'  Treadmill        Leg Press 2x10 44 3x10 44 3x10 44 3x10 44 3x10 44 3x10  Heel raises 2x10 3x10 3x10 3x10 3x10 3x10  Multi-Hip Flex 30 2x10 30 3x10 30 3x10 30 3x10 35 3x10 35 3x10  Multi-Hip Ext 30 2x10 30 3x10 30 3x10 30 3x10 35 3x10 35 3x10  Multi-Hip Abd 30 2x10 30 3x10 30 3x10 30 3x10 35 3x10 35 3x10  Multi-Hip Add        Knee Extension 35 2x10 35 3x10 25 3x10 25 3x10 25 3x10 25 3x10  Leg Curls 30 2x10 30 3x10 30 3x10 30 3x10 30 3x10 30 3x10  Standing shoulder flex        Standing shoulder abd        SLR        Marching        Bridges        Abdmonial Isometrics w/ball        Rebounder    Yellow 3 way x10 ea Yellow 3x10 Yellow 3x10 Yellow 3x10  Wall target        Prone Prop/ Prone Push Up        SKC/Piriformis/HAMS/QUADS        Sit-stand        LAQ 2x10 2x10      Seated march 2x10  3x10 3x10 3x10 3x10  LTR 2x10  3x10 3x10 3x10 3x10  Ball sq 2x10  3x10 3x10 3x10 3x10  Supine clam GTB 3x10  GTB 3x10 GTB 3x10  GTB 3x10 GTB 3x10  Sit-stand low surface x10  x10 x10 x10 x10  Step ups 8 2x10 8 3x10 8 3x10 ea 8 3x10 8 3x10 8 3x10          Ultrasound/ Infrared        HP/CP with/ without  IFC

## 2024-05-27 DIAGNOSIS — R29898 Other symptoms and signs involving the musculoskeletal system: Secondary | ICD-10-CM | POA: Diagnosis not present

## 2024-05-27 NOTE — Progress Notes (Signed)
 [facility]                    PT Daily Progress Note  Patient's Name:Ronnie Hanson                        MRN #[id number]      Date:05/27/2024                        Visit Number: 12                                                                      Progress Related to Long and Short Term Goals:   []  ROM: []  Strength Pain -    while- [] at rest    [] During activity:  []  Functional goal: []  Other: []  Balance:  Patient / Family Education:    []  Progressed HEP to include:  []  Reviewed previous HEP: ______________________________________________________________________  Skilled Service Provided:  To: []  right   []  left     [x]  bilateral    [x]   core/body stability and balance  []  neck   []   shoulder   []  upper arm/  []  elbow   []  lower arm  []  wrist   []  hand   []   fingers  []  back     [x]  hip    []  upper leg    [x]  knee  [x]  lower leg   [x]   ankle  foot       Ther Ex Therapeutic Procedure CPT 97110 : 40 minutes   Manual Tx      Ice / Heat             E-Stim                 Infrared     Vaso-pneumatic compression      [x]  Inc ROM [] Inc joint mob [] Dec pain [] Dec inflam Setting:  [] Dec swelling  [x]  Inc Flex []  Inc Flex [] Dec inflam [] Dec pain [] Inc Healing [] Dec edema  [x]  Inc  Bal []  Inc ROM [] Dec swelling [] Dec swelling [] Dec pain Level      []  1        []  2      []  3  [x]  Inc Strength [] Astym [] Inc circulation [] Inc circulation [] Inc circulation Temp:         F  Progressive  HEP          []   [] tension/ spasms/soft tissue mob [] Inc soft tissue mobility [] Inc tissue mob    [x] Inc Posture [] Correct malaign  Settings:      Subjective:   No complaints today.   Treatment notes:      Assessment of Treatment:  Good effort with all therex today. Strength is improving.   Patient's Response to Treatment:  []  pain  []  pain    []   flexibility      [x]  endurance      [x]    strength     []   AROM      []   PROM       []   Spasms    []  Posture /  alignment    []   Joint  Mobility  []  Other:   Functional Improvement Noted:  Increased ability to  []  walk   []  stand for longer periods   []  dress with less help    []  lift     []   reach     []   bend    []  Other:  Remaining Impairment Requiring Continued Treatment:   [x]   flexibility   [x]   ROM   [x]   pain  [x]  strength   [x]   weakness    [x]   balance   []   swelling   []   inflammation        []  Spasm  [x]  posture/alignment    []   Other:  Plan: [x]   Continue Plan of Care        []   Add:           []   Discharge after next visit    []   Discharge to HEP   []  Other:   PT Billing Documentation Visit Number: 12       Frequently Used Timed Codes Therapeutic Procedure CPT 97110 : 40 minutes                    Total Treatment Time: 40 minute   Attestation Statement:   I personally performed the service, incident to.  DELMER)   Rollene Bellman, PTA   Back Treatment Log   Date 8/20 8/27 9/3 9/12 9/19 9/24 10/3  Bike/Stepper (seat_____) L3.5 10' L3.5 10 L3.5 10' L3.5 10 L3.5 10' L3.5 10' L3.5 10'  Treadmill         Leg Press 2x10 44 3x10 44 3x10 44 3x10 44 3x10 44 3x10 44 3x10  Heel raises 2x10 3x10 3x10 3x10 3x10 3x10 3x10  Multi-Hip Flex 30 2x10 30 3x10 30 3x10 30 3x10 35 3x10 35 3x10 35 3x10  Multi-Hip Ext 30 2x10 30 3x10 30 3x10 30 3x10 35 3x10 35 3x10 35 3x10  Multi-Hip Abd 30 2x10 30 3x10 30 3x10 30 3x10 35 3x10 35 3x10 35 3x10  Multi-Hip Add         Knee Extension 35 2x10 35 3x10 25 3x10 25 3x10 25 3x10 25 3x10 25 3x10  Leg Curls 30 2x10 30 3x10 30 3x10 30 3x10 30 3x10 30 3x10 30 3x10  Standing shoulder flex         Standing shoulder abd         SLR         Marching         Bridges         Abdmonial Isometrics w/ball         Rebounder    Yellow 3 way x10 ea Yellow 3x10 Yellow 3x10 Yellow 3x10 Yellow 3x10  Wall target         Prone Prop/ Prone Push Up         SKC/Piriformis/HAMS/QUADS         Sit-stand         LAQ 2x10 2x10       Seated march 2x10  3x10 3x10 3x10  3x10 3x10  LTR 2x10  3x10 3x10 3x10 3x10 3x10  Ball sq 2x10  3x10 3x10 3x10 3x10 3x10  Supine clam GTB 3x10  GTB 3x10 GTB 3x10 GTB 3x10 GTB 3x10 GTB 3x10  Sit-stand low surface x10  x10 x10 x10 x10 x10  Step ups 8 2x10 8 3x10 8 3x10 ea 8 3x10 8 3x10 8 3x10 8 3x10  Ultrasound/ Infrared         HP/CP with/ without  IFC

## 2024-06-01 DIAGNOSIS — R29898 Other symptoms and signs involving the musculoskeletal system: Secondary | ICD-10-CM | POA: Diagnosis not present

## 2024-06-01 NOTE — Progress Notes (Signed)
 [facility]                    PT Daily Progress Note  Patient's Name:Ronnie Hanson                        MRN #[id number]      Date:06/01/2024                        Visit Number: 13                                                                      Progress Related to Long and Short Term Goals:   []  ROM: []  Strength Pain -    while- [] at rest    [] During activity:  []  Functional goal: []  Other: []  Balance:  Patient / Family Education:    []  Progressed HEP to include:  []  Reviewed previous HEP: ______________________________________________________________________  Skilled Service Provided:  To: []  right   []  left     [x]  bilateral    [x]   core/body stability and balance  []  neck   []   shoulder   []  upper arm/  []  elbow   []  lower arm  []  wrist   []  hand   []   fingers  []  back     [x]  hip    []  upper leg    [x]  knee  [x]  lower leg   [x]   ankle  foot       Ther Ex Therapeutic Procedure CPT 97110 : 40 minutes   Manual Tx      Ice / Heat             E-Stim                 Infrared     Vaso-pneumatic compression      [x]  Inc ROM [] Inc joint mob [] Dec pain [] Dec inflam Setting:  [] Dec swelling  [x]  Inc Flex []  Inc Flex [] Dec inflam [] Dec pain [] Inc Healing [] Dec edema  [x]  Inc  Bal []  Inc ROM [] Dec swelling [] Dec swelling [] Dec pain Level      []  1        []  2      []  3  [x]  Inc Strength [] Astym [] Inc circulation [] Inc circulation [] Inc circulation Temp:         F  Progressive  HEP          []   [] tension/ spasms/soft tissue mob [] Inc soft tissue mobility [] Inc tissue mob    [x] Inc Posture [] Correct malaign  Settings:      Subjective:   No complaints today.  Patient states he fell up the bleachers on Friday at his grandson's baseball game.  Treatment notes:      Assessment of Treatment:  Good effort with all therex today. Strength is improving.   Patient's Response to Treatment:  []  pain  []  pain    []   flexibility      [x]  endurance       [x]    strength     []   AROM      []   PROM       []   Spasms    []  Posture / alignment    []   Joint Mobility  []  Other:   Functional Improvement Noted:  Increased ability to  []  walk   []  stand for longer periods   []  dress with less help    []  lift     []   reach     []   bend    []  Other:  Remaining Impairment Requiring Continued Treatment:   [x]   flexibility   [x]   ROM   [x]   pain  [x]  strength   [x]   weakness    [x]   balance   []   swelling   []   inflammation        []  Spasm  [x]  posture/alignment    []   Other:  Plan: [x]   Continue Plan of Care        []   Add:           []   Discharge after next visit    []   Discharge to HEP   []  Other:   PT Billing Documentation Visit Number: 13       Frequently Used Timed Codes Therapeutic Procedure CPT 97110 : 40 minutes                    Total Treatment Time: 40 minute   Attestation Statement:   I personally performed the service, incident to.  DELMER)   Rollene Bellman, PTA   Back Treatment Log   Date 9/12 9/19 9/24 10/3 10/8  Bike/Stepper (seat_____) L3.5 10 L3.5 10' L3.5 10' L3.5 10' L3.5 10'  Treadmill       Leg Press 44 3x10 44 3x10 44 3x10 44 3x10 44 3x10  Heel raises 3x10 3x10 3x10 3x10 3x10  Multi-Hip Flex 30 3x10 35 3x10 35 3x10 35 3x10 35 3x10  Multi-Hip Ext 30 3x10 35 3x10 35 3x10 35 3x10 35 3x10  Multi-Hip Abd 30 3x10 35 3x10 35 3x10 35 3x10 35 3x10  Multi-Hip Add       Knee Extension 25 3x10 25 3x10 25 3x10 25 3x10 25 3x10  Leg Curls 30 3x10 30 3x10 30 3x10 30 3x10 30 3x10  Standing shoulder flex       Standing shoulder abd       SLR       Marching       Bridges       Abdmonial Isometrics w/ball       Rebounder  Yellow 3x10 Yellow 3x10 Yellow 3x10 Yellow 3x10 Yellow 3x10  Wall target       Prone Prop/ Prone Push Up       SKC/Piriformis/HAMS/QUADS       Sit-stand       LAQ       Seated march 3x10 3x10 3x10 3x10 3x10  LTR 3x10 3x10 3x10 3x10 3x10  Ball sq 3x10 3x10 3x10 3x10 3x10  Supine clam GTB 3x10 GTB  3x10 GTB 3x10 GTB 3x10 GTB 3x10  Sit-stand low surface x10 x10 x10 x10 x10  Step ups 8 3x10 8 3x10 8 3x10 8 3x10 8 3x10         Ultrasound/ Infrared       HP/CP with/ without  IFC

## 2024-06-03 ENCOUNTER — Inpatient Hospital Stay

## 2024-06-03 ENCOUNTER — Other Ambulatory Visit: Payer: Self-pay

## 2024-06-03 ENCOUNTER — Emergency Department

## 2024-06-03 ENCOUNTER — Inpatient Hospital Stay
Admission: EM | Admit: 2024-06-03 | Discharge: 2024-06-05 | DRG: 390 | Disposition: A | Discharge status: Home / Self Care | Source: Ambulatory Visit | Attending: Internal Medicine | Admitting: Internal Medicine

## 2024-06-03 DIAGNOSIS — K5900 Constipation, unspecified: Secondary | ICD-10-CM | POA: Diagnosis present

## 2024-06-03 DIAGNOSIS — I2510 Atherosclerotic heart disease of native coronary artery without angina pectoris: Secondary | ICD-10-CM | POA: Diagnosis not present

## 2024-06-03 DIAGNOSIS — F329 Major depressive disorder, single episode, unspecified: Secondary | ICD-10-CM | POA: Diagnosis present

## 2024-06-03 DIAGNOSIS — Z79899 Other long term (current) drug therapy: Secondary | ICD-10-CM

## 2024-06-03 DIAGNOSIS — K56609 Unspecified intestinal obstruction, unspecified as to partial versus complete obstruction: Principal | ICD-10-CM | POA: Diagnosis present

## 2024-06-03 DIAGNOSIS — G20A1 Parkinson's disease without dyskinesia, without mention of fluctuations: Secondary | ICD-10-CM | POA: Diagnosis present

## 2024-06-03 DIAGNOSIS — Z888 Allergy status to other drugs, medicaments and biological substances status: Secondary | ICD-10-CM | POA: Diagnosis not present

## 2024-06-03 DIAGNOSIS — K5730 Diverticulosis of large intestine without perforation or abscess without bleeding: Secondary | ICD-10-CM | POA: Diagnosis not present

## 2024-06-03 DIAGNOSIS — Z4682 Encounter for fitting and adjustment of non-vascular catheter: Secondary | ICD-10-CM | POA: Diagnosis not present

## 2024-06-03 DIAGNOSIS — E7800 Pure hypercholesterolemia, unspecified: Secondary | ICD-10-CM | POA: Diagnosis present

## 2024-06-03 DIAGNOSIS — Z860100 Personal history of colon polyps, unspecified: Secondary | ICD-10-CM

## 2024-06-03 DIAGNOSIS — R7303 Prediabetes: Secondary | ICD-10-CM | POA: Diagnosis present

## 2024-06-03 DIAGNOSIS — R1030 Lower abdominal pain, unspecified: Secondary | ICD-10-CM | POA: Diagnosis not present

## 2024-06-03 DIAGNOSIS — G43909 Migraine, unspecified, not intractable, without status migrainosus: Secondary | ICD-10-CM | POA: Diagnosis present

## 2024-06-03 DIAGNOSIS — I10 Essential (primary) hypertension: Secondary | ICD-10-CM | POA: Diagnosis not present

## 2024-06-03 DIAGNOSIS — F419 Anxiety disorder, unspecified: Secondary | ICD-10-CM | POA: Diagnosis present

## 2024-06-03 DIAGNOSIS — Z7982 Long term (current) use of aspirin: Secondary | ICD-10-CM

## 2024-06-03 DIAGNOSIS — N281 Cyst of kidney, acquired: Secondary | ICD-10-CM | POA: Diagnosis not present

## 2024-06-03 DIAGNOSIS — Z882 Allergy status to sulfonamides status: Secondary | ICD-10-CM

## 2024-06-03 DIAGNOSIS — R630 Anorexia: Secondary | ICD-10-CM | POA: Diagnosis not present

## 2024-06-03 LAB — CBC
HCT: 42.8 % (ref 39.0–52.0)
Hemoglobin: 14 g/dL (ref 13.0–17.0)
MCH: 30.5 pg (ref 26.0–34.0)
MCHC: 32.7 g/dL (ref 30.0–36.0)
MCV: 93.2 fL (ref 80.0–100.0)
Platelets: 238 K/uL (ref 150–400)
RBC: 4.59 MIL/uL (ref 4.22–5.81)
RDW: 12.6 % (ref 11.5–15.5)
WBC: 9.5 K/uL (ref 4.0–10.5)
nRBC: 0 % (ref 0.0–0.2)

## 2024-06-03 LAB — COMPREHENSIVE METABOLIC PANEL WITH GFR
ALT: 6 U/L (ref 0–44)
AST: 19 U/L (ref 15–41)
Albumin: 4.7 g/dL (ref 3.5–5.0)
Alkaline Phosphatase: 55 U/L (ref 38–126)
Anion gap: 9 (ref 5–15)
BUN: 25 mg/dL — ABNORMAL HIGH (ref 8–23)
CO2: 30 mmol/L (ref 22–32)
Calcium: 9.2 mg/dL (ref 8.9–10.3)
Chloride: 99 mmol/L (ref 98–111)
Creatinine, Ser: 1.07 mg/dL (ref 0.61–1.24)
GFR, Estimated: >60 mL/min (ref >60)
Glucose, Bld: 114 mg/dL — ABNORMAL HIGH (ref 70–99)
Potassium: 4.4 mmol/L (ref 3.5–5.1)
Sodium: 138 mmol/L (ref 135–145)
Total Bilirubin: 0.9 mg/dL (ref 0.0–1.2)
Total Protein: 7.5 g/dL (ref 6.5–8.1)

## 2024-06-03 LAB — LIPASE, BLOOD: Lipase: 26 U/L (ref 11–51)

## 2024-06-03 LAB — URINALYSIS, ROUTINE W REFLEX MICROSCOPIC
Bilirubin Urine: NEGATIVE
Glucose, UA: NEGATIVE mg/dL
Hgb urine dipstick: NEGATIVE
Ketones, ur: NEGATIVE mg/dL
Leukocytes,Ua: NEGATIVE
Nitrite: NEGATIVE
Protein, ur: NEGATIVE mg/dL
Specific Gravity, Urine: 1.023 (ref 1.005–1.030)
pH: 5 (ref 5.0–8.0)

## 2024-06-03 MED ORDER — ACETAMINOPHEN 325 MG PO TABS: 650.0000 mg | ORAL_TABLET | Freq: Four times a day (QID) | ORAL | Status: DC | PRN

## 2024-06-03 MED ORDER — ACETAMINOPHEN 650 MG RE SUPP: 650.0000 mg | Freq: Four times a day (QID) | RECTAL | Status: DC | PRN

## 2024-06-03 MED ORDER — HEPARIN SODIUM (PORCINE) 5000 UNIT/ML IJ SOLN
5000.0000 [IU] | Freq: Three times a day (TID) | INTRAMUSCULAR | Status: DC
  Administered 2024-06-03 – 2024-06-05 (×6): 5000 [IU] via SUBCUTANEOUS
  Filled 2024-06-03 (×6): qty 1

## 2024-06-03 MED ORDER — KETOROLAC TROMETHAMINE 15 MG/ML IJ SOLN
7.5000 mg | Freq: Once | INTRAMUSCULAR | Status: AC
  Administered 2024-06-03: 7.5 mg via INTRAVENOUS
  Filled 2024-06-03: qty 1

## 2024-06-03 MED ORDER — ONDANSETRON HCL 4 MG/2ML IJ SOLN
4.0000 mg | Freq: Once | INTRAMUSCULAR | Status: AC
  Administered 2024-06-03: 4 mg via INTRAVENOUS
  Filled 2024-06-03: qty 2

## 2024-06-03 MED ORDER — ONDANSETRON HCL 4 MG/2ML IJ SOLN
4.0000 mg | Freq: Four times a day (QID) | INTRAMUSCULAR | Status: DC | PRN
Start: 2024-06-03 — End: 2024-06-05

## 2024-06-03 MED ORDER — ONDANSETRON HCL 4 MG PO TABS: 4.0000 mg | ORAL_TABLET | Freq: Four times a day (QID) | ORAL | Status: DC | PRN

## 2024-06-03 MED ORDER — LACTATED RINGERS IV BOLUS
1000.0000 mL | Freq: Once | INTRAVENOUS | Status: AC
  Administered 2024-06-03: 1000 mL via INTRAVENOUS

## 2024-06-03 MED ORDER — HYDRALAZINE HCL 20 MG/ML IJ SOLN
5.0000 mg | INTRAMUSCULAR | Status: AC | PRN
  Administered 2024-06-03 – 2024-06-04 (×2): 5 mg via INTRAVENOUS
  Filled 2024-06-03 (×2): qty 1

## 2024-06-03 MED ORDER — SODIUM CHLORIDE 0.9 % IV BOLUS
1000.0000 mL | Freq: Once | INTRAVENOUS | Status: AC
  Administered 2024-06-03: 1000 mL via INTRAVENOUS

## 2024-06-03 MED ORDER — IOHEXOL 300 MG/ML  SOLN
100.0000 mL | Freq: Once | INTRAMUSCULAR | Status: AC | PRN
  Administered 2024-06-03: 100 mL via INTRAVENOUS

## 2024-06-03 NOTE — Progress Notes (Signed)
 Dr Cleatus notified pt concerned about blood pressure 163/90. He states he hasn't had his bp meds.

## 2024-06-03 NOTE — ED Provider Notes (Addendum)
 [facility] Provider Note    Event Date/Time   First MD Initiated Contact with Patient 06/03/24 1105     (approximate)   History   Chief Complaint: Abdominal Pain   HPI  Ronnie Hanson is a 78 y.o. male with a history of CAD, hypertension, anxiety who comes ED complaining of abdominal bloating, generalized pain, nausea for the past 2 or 3 days.  Also during this time, no bowel movements, not passing flatus.  No fever or chills, no chest pain or shortness of breath.        Past Medical History:  Diagnosis Date   Anxiety    Colonic polyp    Coronary artery disease of native artery of native heart with stable angina pectoris    a.) LHC 03/13/2015: 50% pLAD, 40/60% mLAD - med mgmt   Diverticulosis    Heart murmur    Hypercholesteremia    Hypertension    Migraine    Pre-diabetes    Sleep difficulties    a.) takes trazodone PRN    Current Outpatient Rx   Order #: [id number] Class: Historical Med   Order #: [id number] Class: Historical Med   Order #: [id number] Class: Historical Med   Order #: [id number] Class: Historical Med   Order #: [id number] Class: Historical Med   Order #: [id number] Class: Historical Med   Order #: [id number] Class: Historical Med   Order #: [id number] Class: Historical Med   Order #: [id number] Class: Historical Med    Past Surgical History:  Procedure Laterality Date   CARDIAC CATHETERIZATION N/A 03/13/2015   Procedure: Left Heart Cath;  Surgeon: Marsa Dooms, MD;  Location: [facility] INVASIVE CV LAB;  Service: Cardiovascular;  Laterality: N/A;   COLONOSCOPY N/A 09/07/2015   Procedure: COLONOSCOPY;  Surgeon: Deward CINDERELLA Piedmont, MD;  Location: [facility] ENDOSCOPY;  Service: Gastroenterology;  Laterality: N/A;   COLONOSCOPY  2011   COLONOSCOPY WITH PROPOFOL  N/A 02/19/2021   Procedure: COLONOSCOPY WITH PROPOFOL ;  Surgeon: Jinny Carmine, MD;  Location: [facility] ENDOSCOPY;  Service: Endoscopy;  Laterality: N/A;   INSERTION OF MESH  Bilateral 10/29/2022   Procedure: INSERTION OF MESH;  Surgeon: Rodolph Romano, MD;  Location: [facility] ORS;  Service: General;  Laterality: Bilateral;   TONSILLECTOMY  1953    Physical Exam   Triage Vital Signs: ED Triage Vitals  Encounter Vitals Group     BP 06/03/24 1011 127/86     Girls Systolic BP Percentile --      Girls Diastolic BP Percentile --      Boys Systolic BP Percentile --      Boys Diastolic BP Percentile --      Pulse Rate 06/03/24 1011 73     Resp 06/03/24 1011 18     Temp 06/03/24 1011 98.7 F (37.1 C)     Temp src --      SpO2 06/03/24 1011 100 %     Weight 06/03/24 1011 142 lb (64.4 kg)     Height 06/03/24 1011 5' 7 (1.702 m)     Head Circumference --      Peak Flow --      Pain Score 06/03/24 1010 5     Pain Loc --      Pain Education --      Exclude from Growth Chart --     Most recent vital signs: Vitals:   06/03/24 1115 06/03/24 1428  BP:  (!) 159/81  Pulse:  70  Resp:  18  Temp:  98.6 F (37 C)  SpO2:  100% 100%    General: Awake, no distress.  CV:  Good peripheral perfusion.  Regular rate and rhythm Resp:  Normal effort.  Clear lungs Abd:  No distention.  Soft with left lower quadrant tenderness.  There is a soft ventral hernia without signs of tissue entrapment, nontender.  No palpable inguinal hernia Other:  No rash.  Symmetric lower extremities calf circumference   ED Results / Procedures / Treatments   Labs (all labs ordered are listed, but only abnormal results are displayed) Labs Reviewed  COMPREHENSIVE METABOLIC PANEL WITH GFR - Abnormal; Notable for the following components:      Result Value   Glucose, Bld 114 (*)    BUN 25 (*)    All other components within normal limits  URINALYSIS, ROUTINE W REFLEX MICROSCOPIC - Abnormal; Notable for the following components:   Color, Urine YELLOW (*)    APPearance CLEAR (*)    All other components within normal limits  LIPASE, BLOOD  CBC     EKG    RADIOLOGY CT abdomen  pelvis interpreted by me, shows dilated loops of bowel concerning for SBO.  Radiology report reviewed   PROCEDURES:  Procedures   MEDICATIONS ORDERED IN ED: Medications  sodium chloride  0.9 % bolus 1,000 mL (0 mLs Intravenous Stopped 06/03/24 1412)  ondansetron  (ZOFRAN ) injection 4 mg (4 mg Intravenous Given 06/03/24 1155)  ketorolac (TORADOL) 15 MG/ML injection 7.5 mg (7.5 mg Intravenous Given 06/03/24 1155)  iohexol (OMNIPAQUE) 300 MG/ML solution 100 mL (100 mLs Intravenous Contrast Given 06/03/24 1302)  lactated ringers  bolus 1,000 mL (1,000 mLs Intravenous New Bag/Given 06/03/24 1442)     IMPRESSION / MDM / ASSESSMENT AND PLAN / ED COURSE  I reviewed the triage vital signs and the nursing notes.  DDx: Bowel obstruction, diverticulitis, cystitis, intestinal gas, constipation, electrolyte derangement, AKI  Patient's presentation is most consistent with acute presentation with potential threat to life or bodily function.  Patient presents with constipation, absent flatus, abdominal pain for the past 2 to 3 days.  Exam is fairly reassuring but with symptoms and left lower quadrant tenderness will need to obtain CT.  Vitals and initial lab work are normal.   Clinical Course as of 06/03/24 1454  Fri Jun 03, 2024  1401 CT positive for bowel obstruction.   [PS]  [id number] Presentation discussed with general surgery Who will review images and advise on whether NG tube decompression would be recommended at this point.  Will need to admit for bowel rest and IV hydration. [PS]  1454 Case d/w hospitalist [PS]    Clinical Course User Index [PS] Viviann Pastor, MD     FINAL CLINICAL IMPRESSION(S) / ED DIAGNOSES   Final diagnoses:  SBO (small bowel obstruction) (HCC)     Rx / DC Orders   ED Discharge Orders     None        Note:  This document was prepared using Dragon voice recognition software and may include unintentional dictation errors.   Viviann Pastor,  MD 06/03/24 1434    Viviann Pastor, MD 06/03/24 [phone]

## 2024-06-03 NOTE — ED Triage Notes (Signed)
 Pt c/o boating, abd tenderness, nausea started yesterday. NAD noted.

## 2024-06-03 NOTE — H&P (Signed)
 History and Physical    Ronnie Hanson FMW:[id number] DOB: 11-Feb-1946 DOA: 06/03/2024  DOS: the patient was seen and examined on 06/03/2024  PCP: Lenon Layman ORN, MD   Patient coming from: Home  I have personally briefly reviewed patient's old medical records in [facility] and CareEverywhere  HPI:   Ronnie Hanson is a 78 y.o. year old male with past medical history of hypertension, hyperlipidemia c/b by CAD, Prediabetes, and MDD presenting to the ED with worsening abdominal pain, nausea for the last 2 to 3 days.  Patient reports he has not had a bowel movement or had passed any gas.   Patient states he was feeling at baseline until 3 days ago and that the last time he had a bowel movement.  He has been having worsening bloating, and a lot of nausea but has not vomited.  He reports he had a hernia repair done 2 years ago.  He denies any other complaints and states he regularly follows with his primary care doctor and sees a cardiologist along with neurologist.  He sees a neurologist given concern for Parkinson's disease.  ED Course: On arrival to the ED patient was noted to be hemodynamically stable.  I concern for SBO so abdominal imaging was obtained which showed small bowel obstruction with transition point in the left lower quadrant.  General surgery was consulted by the EDP who are following.  There going to review images and advise whether NG tube decompression is recommended at this point.  Appreciate their recommendations.  [facility] contacted for admission.  Review of Systems: As mentioned in the history of present illness. All other systems reviewed and are negative.  Past Medical History:  Diagnosis Date   Anxiety    Colonic polyp    Coronary artery disease of native artery of native heart with stable angina pectoris    a.) LHC 03/13/2015: 50% pLAD, 40/60% mLAD - med mgmt   Diverticulosis    Heart murmur    Hypercholesteremia    Hypertension     Migraine    Pre-diabetes    Sleep difficulties    a.) takes trazodone PRN    Past Surgical History:  Procedure Laterality Date   CARDIAC CATHETERIZATION N/A 03/13/2015   Procedure: Left Heart Cath;  Surgeon: Marsa Dooms, MD;  Location: [facility] INVASIVE CV LAB;  Service: Cardiovascular;  Laterality: N/A;   COLONOSCOPY N/A 09/07/2015   Procedure: COLONOSCOPY;  Surgeon: Deward CINDERELLA Piedmont, MD;  Location: [facility] ENDOSCOPY;  Service: Gastroenterology;  Laterality: N/A;   COLONOSCOPY  2011   COLONOSCOPY WITH PROPOFOL  N/A 02/19/2021   Procedure: COLONOSCOPY WITH PROPOFOL ;  Surgeon: Jinny Carmine, MD;  Location: [facility] ENDOSCOPY;  Service: Endoscopy;  Laterality: N/A;   INSERTION OF MESH Bilateral 10/29/2022   Procedure: INSERTION OF MESH;  Surgeon: Rodolph Romano, MD;  Location: [facility] ORS;  Service: General;  Laterality: Bilateral;   TONSILLECTOMY  1953     Allergies  Allergen Reactions   Sulfa Antibiotics Hives    History reviewed. No pertinent family history.  Prior to Admission medications   Medication Sig Start Date End Date Taking? Authorizing Provider  amLODipine (NORVASC) 2.5 MG tablet Take 2.5 mg by mouth at bedtime. 08/30/20   [provider]  aspirin 81 MG EC tablet Take 81 mg by mouth daily.    [provider]  carvedilol (COREG) 25 MG tablet Take 25 mg by mouth 2 (two) times daily. 11/07/20   [provider]  Multiple Vitamins-Minerals (CENTRUM SILVER  50+MEN PO) Take 1 tablet by mouth daily.    [provider]  polyethylene glycol (MIRALAX / GLYCOLAX) 17 g packet Take 17 g by mouth daily as needed.    [provider]  psyllium (REGULOID) 0.52 g capsule Take 5 capsules by mouth 2 (two) times daily.    [provider]  sertraline (ZOLOFT) 50 MG tablet Take 50 mg by mouth daily.    [provider]  simvastatin (ZOCOR) 20 MG tablet Take 20 mg by mouth at bedtime.    [provider]  traZODone (DESYREL) 50 MG  tablet Take 50 mg by mouth at bedtime. 11/19/20   [provider]      reports that he has never smoked. He has never used smokeless tobacco. He reports current alcohol use. He reports that he does not use drugs. Lives with wife.  They have adult kids including 1 in [place] and 1 in [place]. Tobacco- Denies use. EtOH-drinks 1 glass of wine nightly Illicit drug use- denies use.  IADLs/ADLs- can person independently at baseline    Physical Exam: Vitals:   06/03/24 1011 06/03/24 1115 06/03/24 1428  BP: 127/86  (!) 159/81  Pulse: 73  70  Resp: 18  18  Temp: 98.7 F (37.1 C)  98.6 F (37 C)  TempSrc:   Oral  SpO2: 100% 100% 100%  Weight: 64.4 kg    Height: 5' 7 (1.702 m)       Gen: Pleasant male in no acute distress HENT: Normocephalic atraumatic, dry mucous membranes CV: Regular rate and rhythm, good pulses in all extremities. Resp: CTAB Abd: Distended abdomen, no bowel sounds, slight discomfort with TTP MSK: No asymmetry, good strength in all extremities Skin: No rash on examined skin Neuro: Alert and oriented x 4 Psych: Normal mood and affect   Labs on Admission: I have personally reviewed following labs and imaging studies  CBC: Recent Labs  Lab 06/03/24 1011  WBC 9.5  HGB 14.0  HCT 42.8  MCV 93.2  PLT 238   Basic Metabolic Panel: Recent Labs  Lab 06/03/24 1011  NA 138  K 4.4  CL 99  CO2 30  GLUCOSE 114*  BUN 25*  CREATININE 1.07  CALCIUM 9.2   GFR: Estimated Creatinine Clearance: 51.8 mL/min (by C-G formula based on SCr of 1.07 mg/dL). Liver Function Tests: Recent Labs  Lab 06/03/24 1011  AST 19  ALT 6  ALKPHOS 55  BILITOT 0.9  PROT 7.5  ALBUMIN 4.7   Recent Labs  Lab 06/03/24 1011  LIPASE 26   No results for input(s): AMMONIA in the last 168 hours. Coagulation Profile: No results for input(s): INR, PROTIME in the last 168 hours. Cardiac Enzymes: No results for input(s): CKTOTAL, CKMB, CKMBINDEX,  TROPONINI, TROPONINIHS in the last 168 hours. BNP (last 3 results) No results for input(s): BNP in the last 8760 hours. HbA1C: No results for input(s): HGBA1C in the last 72 hours. CBG: No results for input(s): GLUCAP in the last 168 hours. Lipid Profile: No results for input(s): CHOL, HDL, LDLCALC, TRIG, CHOLHDL, LDLDIRECT in the last 72 hours. Thyroid Function Tests: No results for input(s): TSH, T4TOTAL, FREET4, T3FREE, THYROIDAB in the last 72 hours. Anemia Panel: No results for input(s): VITAMINB12, FOLATE, FERRITIN, TIBC, IRON, RETICCTPCT in the last 72 hours. Urine analysis:    Component Value Date/Time   COLORURINE YELLOW (A) 06/03/2024 1011   APPEARANCEUR CLEAR (A) 06/03/2024 1011   LABSPEC 1.023 06/03/2024 1011   PHURINE 5.0 06/03/2024  1011   GLUCOSEU NEGATIVE 06/03/2024 1011   HGBUR NEGATIVE 06/03/2024 1011   BILIRUBINUR NEGATIVE 06/03/2024 1011   KETONESUR NEGATIVE 06/03/2024 1011   PROTEINUR NEGATIVE 06/03/2024 1011   NITRITE NEGATIVE 06/03/2024 1011   LEUKOCYTESUR NEGATIVE 06/03/2024 1011    Radiological Exams on Admission: I have personally reviewed images CT ABDOMEN PELVIS W CONTRAST Result Date: 06/03/2024 EXAM: CT ABDOMEN AND PELVIS WITH CONTRAST 06/03/2024 01:07:20 PM TECHNIQUE: CT of the abdomen and pelvis was performed with the administration of 100 mL of iohexol (OMNIPAQUE) 300 MG/ML solution. Multiplanar reformatted images are provided for review. Automated exposure control, iterative reconstruction, and/or weight-based adjustment of the mA/kV was utilized to reduce the radiation dose to as low as reasonably achievable. COMPARISON: None available. CLINICAL HISTORY: Bowel obstruction suspected; LLQ abdominal pain. Arrives from [facility] with c/o abd pain, bloating and nausea x 1 day. FINDINGS: LOWER CHEST: No acute abnormality. LIVER: Mild fatty infiltration of the liver is noted. No discrete lesions are present. GALLBLADDER  AND BILE DUCTS: Gallbladder is unremarkable. No biliary ductal dilatation. SPLEEN: No acute abnormality. PANCREAS: No acute abnormality. ADRENAL GLANDS: No acute abnormality. KIDNEYS, URETERS AND BLADDER: Layering calcifications are present in an otherwise simple cyst in the upper pole of the left kidney measuring 2.7 x 2.9 cm. No stones in the kidneys or ureters. No hydronephrosis. No perinephric or periureteral stranding. Urinary bladder is unremarkable. GI AND BOWEL: Proximal small bowel obstruction is present. Dilated loops of small bowel measure up to 2.7 cm. Free fluid is present within the mesentery of the left lower quadrant. Free fluid is present along the left paracolic gutter. Transition point is in the left lower quadrant. No discrete mass lesion is present. Bowel distal to this area is decompressed. Minimal diverticular disease changes are present in the sigmoid colon without inflammatory changes to suggest diverticulitis. PERITONEUM AND RETROPERITONEUM: Free fluid is present within the mesentery in the left upper quadrant. Free fluid is present along the left paracolic gutter. No free air. VASCULATURE: Aorta is normal in caliber. LYMPH NODES: No lymphadenopathy. REPRODUCTIVE ORGANS: The prostate gland is enlarged measuring 5.7 cm in transverse diameter. BONES AND SOFT TISSUES: Grade 1 anterolisthesis at L3-4 appears chronic. No acute osseous abnormality. No focal soft tissue abnormality. IMPRESSION: 1. Proximal small bowel obstruction with transition point in the left lower quadrant. No discrete mass lesion. Free fluid in the left lower quadrant mesentery and along the left paracolic gutter. 2. Prostatomegaly. Electronically signed by: Lonni Necessary MD 06/03/2024 01:37 PM EDT RP Workstation: [id number]      Assessment/Plan Principal Problem:   Small bowel obstruction (HCC) Active Problems:   SBO (small bowel obstruction) (HCC)    Small Bowel Obstruction Pt with symptoms of abdominal  pain, nausea with imaging findings of SBO. CBC, CMP. Pt is currently NPO and has NG tube placed for decomopression. Surgery consulted. Appreciate their recommendations. Plan for gastrografin tomorrow if no improvement with NG tube alone per general surgery.   Chronic Problems: Hypertension: holding oral meds. Use IV as needed.  HLD: holding home statin.  Parkinson's Disease: holding home med. Can restart once SBO improves.  MDD: holding home Zoloft and trazodone. Restart once SBO improves.   VTE prophylaxis:  Lovenox  Access: Lines: Code Status:  Full Code Telemetry:  Admission status: Inpatient, Telemetry bed Patient is from: Home  Anticipated d/c is to: Home  Anticipated d/c is in: 2-3 days    Family Communication: Updated at bedside   Consults called: General Surgery   Severity  of Illness: The appropriate patient status for this patient is INPATIENT. Inpatient status is judged to be reasonable and necessary in order to provide the required intensity of service to ensure the patient's safety. The patient's presenting symptoms, physical exam findings, and initial radiographic and laboratory data in the context of their chronic comorbidities is felt to place them at high risk for further clinical deterioration. Furthermore, it is not anticipated that the patient will be medically stable for discharge from the hospital within 2 midnights of admission.   * I certify that at the point of admission it is my clinical judgment that the patient will require inpatient hospital care spanning beyond 2 midnights from the point of admission due to high intensity of service, high risk for further deterioration and high frequency of surveillance required.DEWAINE Morene Bathe, MD [place]ol[place] DEL. [facility]

## 2024-06-03 NOTE — ED Notes (Signed)
 Advanced the NG tube an additional 6 cm per radiology consult

## 2024-06-03 NOTE — ED Triage Notes (Signed)
 Arrives from [facility] with c/o abd pain, bloating and nausea x 1 day.  AAOx3. Skin warm and dry. NAD

## 2024-06-03 NOTE — ED Notes (Signed)
 Pt states his last bowel movement was approx 3 days ago and was very little. Pt states that he normally has bowel movements ever 3-4 days, however he feels constipated at this time.

## 2024-06-03 NOTE — ED Notes (Signed)
 Pt expressed that he ambulates freely without assistance

## 2024-06-03 NOTE — Consult Note (Signed)
 Patient ID: Ronnie Hanson, male   DOB: Jan 11, 1946, 78 y.o.   MRN: [id number] CC:: SBO History of Present Illness Ronnie Hanson is a 78 y.o. male with past medical history as below who presents in consultation for small bowel obstruction.  The patient reports that over the last 2 to 3 days he has become increasingly distended with nausea.  He also reports that his main complaint is burping a lot.  He has not been passing gas and has not had a bowel movement in 3 days.  However, he does say sometimes he goes up to 4 days without having a bowel movement.  He denies any episodes of emesis.  He denies any fevers and chills.  His surgical history is consistent with bilateral robotic assisted inguinal hernia repair with mesh..  Past Medical History Past Medical History:  Diagnosis Date   Anxiety    Colonic polyp    Coronary artery disease of native artery of native heart with stable angina pectoris    a.) LHC 03/13/2015: 50% pLAD, 40/60% mLAD - med mgmt   Diverticulosis    Heart murmur    Hypercholesteremia    Hypertension    Migraine    Pre-diabetes    Sleep difficulties    a.) takes trazodone PRN       Past Surgical History:  Procedure Laterality Date   CARDIAC CATHETERIZATION N/A 03/13/2015   Procedure: Left Heart Cath;  Surgeon: Marsa Dooms, MD;  Location: [facility] INVASIVE CV LAB;  Service: Cardiovascular;  Laterality: N/A;   COLONOSCOPY N/A 09/07/2015   Procedure: COLONOSCOPY;  Surgeon: Deward CINDERELLA Piedmont, MD;  Location: [facility] ENDOSCOPY;  Service: Gastroenterology;  Laterality: N/A;   COLONOSCOPY  2011   COLONOSCOPY WITH PROPOFOL  N/A 02/19/2021   Procedure: COLONOSCOPY WITH PROPOFOL ;  Surgeon: Jinny Carmine, MD;  Location: [facility] ENDOSCOPY;  Service: Endoscopy;  Laterality: N/A;   INSERTION OF MESH Bilateral 10/29/2022   Procedure: INSERTION OF MESH;  Surgeon: Rodolph Romano, MD;  Location: [facility] ORS;  Service: General;  Laterality: Bilateral;   TONSILLECTOMY   1953    Allergies  Allergen Reactions   Sulfa Antibiotics Hives    No current facility-administered medications for this encounter.   Current Outpatient Medications  Medication Sig Dispense Refill   amLODipine (NORVASC) 2.5 MG tablet Take 2.5 mg by mouth at bedtime.     aspirin 81 MG EC tablet Take 81 mg by mouth daily.     carvedilol (COREG) 25 MG tablet Take 25 mg by mouth 2 (two) times daily.     Multiple Vitamins-Minerals (CENTRUM SILVER 50+MEN PO) Take 1 tablet by mouth daily.     polyethylene glycol (MIRALAX / GLYCOLAX) 17 g packet Take 17 g by mouth daily as needed.     psyllium (REGULOID) 0.52 g capsule Take 5 capsules by mouth 2 (two) times daily.     sertraline (ZOLOFT) 50 MG tablet Take 50 mg by mouth daily.     simvastatin (ZOCOR) 20 MG tablet Take 20 mg by mouth at bedtime.     traZODone (DESYREL) 50 MG tablet Take 50 mg by mouth at bedtime.      Family History History reviewed. No pertinent family history.     Social History Social History   Tobacco Use   Smoking status: Never   Smokeless tobacco: Never  Vaping Use   Vaping status: Never Used  Substance Use Topics   Alcohol use: Yes    Comment: glass of wine with dinner   Drug  use: No        ROS Full ROS of systems performed and is otherwise negative there than what is stated in the HPI  Physical Exam Blood pressure (!) 159/81, pulse 70, temperature 98.6 F (37 C), temperature source Oral, resp. rate 18, height 5' 7 (1.702 m), weight 64.4 kg, SpO2 100%.  Alert and oriented x 3, normal breathing room air, regular rate and rhythm abdomen soft, distended, mild tenderness just at the umbilicus, no rebound tenderness or guarding.  Robotic surgical scars have healed well.  Moving extremities spontaneously.  Wears glasses Data Reviewed Labs consistent with a normal creatinine.  He does not have a leukocytosis.  I independently reviewed his CT scan.  The stomach is not very distended although his small bowel  proximally is distended and a little bit thickened.  He does have a transition point and there does appear to be some fluid in the left paracolic gutter.  I have personally reviewed the patient's imaging and medical records.    Assessment/Plan    78 year old male with 2-day history of obstipation as well as nausea and burping.  On CT scan I am concerned with small bowel obstruction.  We will place NG tube.  This will allow for decompression and hopefully tomorrow we can proceed with a Gastrografin challenge if he does not start to have bowel function tonight.  I discussed with him the treatment escalation after NG tube placement is potential contrast study tomorrow versus surgical intervention if he does not have resolution of his SBO      Ronnie Hanson 06/03/2024, 3:01 PM

## 2024-06-04 ENCOUNTER — Inpatient Hospital Stay

## 2024-06-04 DIAGNOSIS — Z4682 Encounter for fitting and adjustment of non-vascular catheter: Secondary | ICD-10-CM | POA: Diagnosis not present

## 2024-06-04 DIAGNOSIS — K56609 Unspecified intestinal obstruction, unspecified as to partial versus complete obstruction: Secondary | ICD-10-CM | POA: Diagnosis not present

## 2024-06-04 LAB — BASIC METABOLIC PANEL WITH GFR
Anion gap: 8 (ref 5–15)
BUN: 19 mg/dL (ref 8–23)
CO2: 27 mmol/L (ref 22–32)
Calcium: 8.7 mg/dL — ABNORMAL LOW (ref 8.9–10.3)
Chloride: 101 mmol/L (ref 98–111)
Creatinine, Ser: 1.08 mg/dL (ref 0.61–1.24)
GFR, Estimated: >60 mL/min (ref >60)
Glucose, Bld: 97 mg/dL (ref 70–99)
Potassium: 4 mmol/L (ref 3.5–5.1)
Sodium: 136 mmol/L (ref 135–145)

## 2024-06-04 LAB — CBC
HCT: 36.9 % — ABNORMAL LOW (ref 39.0–52.0)
Hemoglobin: 12.8 g/dL — ABNORMAL LOW (ref 13.0–17.0)
MCH: 31.6 pg (ref 26.0–34.0)
MCHC: 34.7 g/dL (ref 30.0–36.0)
MCV: 91.1 fL (ref 80.0–100.0)
Platelets: 223 K/uL (ref 150–400)
RBC: 4.05 MIL/uL — ABNORMAL LOW (ref 4.22–5.81)
RDW: 12.6 % (ref 11.5–15.5)
WBC: 9.2 K/uL (ref 4.0–10.5)
nRBC: 0 % (ref 0.0–0.2)

## 2024-06-04 MED ORDER — CARBIDOPA-LEVODOPA 25-100 MG PO TABS
1.0000 | ORAL_TABLET | Freq: Three times a day (TID) | ORAL | Status: DC
  Administered 2024-06-04 (×2): 1
  Filled 2024-06-04 (×2): qty 1

## 2024-06-04 MED ORDER — DIPHENHYDRAMINE HCL 50 MG/ML IJ SOLN
12.5000 mg | Freq: Once | INTRAMUSCULAR | Status: AC | PRN
  Administered 2024-06-04: 12.5 mg via INTRAVENOUS
  Filled 2024-06-04: qty 1

## 2024-06-04 MED ORDER — TRAZODONE HCL 50 MG PO TABS
50.0000 mg | ORAL_TABLET | Freq: Every day | ORAL | Status: DC
Start: 2024-06-04 — End: 2024-06-05
  Administered 2024-06-04: 50 mg
  Filled 2024-06-04: qty 1

## 2024-06-04 MED ORDER — LACTATED RINGERS IV SOLN: INTRAVENOUS | Status: AC

## 2024-06-04 MED ORDER — DIATRIZOATE MEGLUMINE & SODIUM 66-10 % PO SOLN
90.0000 mL | Freq: Once | ORAL | Status: AC
  Administered 2024-06-04: 90 mL via NASOGASTRIC

## 2024-06-04 NOTE — Progress Notes (Signed)
 Patient ambulated in hall with nursing student walking several laps around nurses station. Patient tolerated well, denied pain/discomfort.

## 2024-06-04 NOTE — Progress Notes (Signed)
 CC: SBO Subjective: Patient reports feeling a little bit better today.  He says he feels distended still but is not burping as much.  He has not been having any flatus or passing any bowel movements.  Objective: Vital signs in last 24 hours: Temp:  [97.5 F (36.4 C)-98.6 F (37 C)] 97.5 F (36.4 C) (10/11 0901) Pulse Rate:  [70-83] 74 (10/11 0901) Resp:  [15-18] 15 (10/11 0901) BP: (135-163)/(65-90) 137/72 (10/11 0901) SpO2:  [93 %-100 %] 95 % (10/11 0901) Weight:  [60.6 kg] 60.6 kg (10/10 2100) Last BM Date : 05/31/24  Intake/Output from previous day: 10/10 0701 - 10/11 0700 In: 210 [P.O.:150; NG/GT:60] Out: 1125 [Urine:625; Emesis/NG output:500] Intake/Output this shift: Total I/O In: -  Out: 200 [Urine:200]  Physical exam:  Abdomen is soft, mildly distended approximately the same distention as yesterday, NG tube with bilious output in canister, moving all extremity spontaneously  Lab Results: CBC  Recent Labs    06/03/24 1011 06/04/24 0442  WBC 9.5 9.2  HGB 14.0 12.8*  HCT 42.8 36.9*  PLT 238 223   BMET Recent Labs    06/03/24 1011 06/04/24 0442  NA 138 136  K 4.4 4.0  CL 99 101  CO2 30 27  GLUCOSE 114* 97  BUN 25* 19  CREATININE 1.07 1.08  CALCIUM 9.2 8.7*   PT/INR No results for input(s): LABPROT, INR in the last 72 hours. ABG No results for input(s): PHART, HCO3 in the last 72 hours.  Invalid input(s): PCO2, PO2  Studies/Results: DG Abd Portable 1V-Small Bowel Protocol-Position Verification Result Date: 06/04/2024 EXAM: 1 VIEW XRAY OF THE ABDOMEN 06/04/2024 09:29:01 AM COMPARISON: 06/03/2024 CLINICAL HISTORY: Encounter for imaging study to confirm nasogastric (NG) tube placement [id number]. Encounter for NG tube placement. FINDINGS: LINES, TUBES AND DEVICES: Enteric tube in place with distal tip and side port terminating within the expected location of the gastric body. BOWEL: Few dilated loops of small bowel within the left hemiabdomen,  similar to the prior exam. SOFT TISSUES: No opaque urinary calculi. BONES: No acute osseous abnormality. IMPRESSION: 1. Enteric tube appropriately positioned with tip and side port in the gastric body. 2. Few dilated small-bowel loops in the left abdomen, similar to prior. Electronically signed by: Waddell Calk MD 06/04/2024 10:04 AM EDT RP Workstation: [id number]   DG Abd 1 View Result Date: 06/03/2024 CLINICAL DATA:  Check gastric catheter placement EXAM: ABDOMEN - 1 VIEW COMPARISON:  Film from earlier in the same day. FINDINGS: Gastric catheter has been advanced deeper into the stomach. Proximal side port now lies within the gastric lumen. IMPRESSION: Gastric catheter as described in satisfactory position. Electronically Signed   By: Oneil Devonshire M.D.   On: 06/03/2024 20:47   DG Abdomen 1 View Result Date: 06/03/2024 CLINICAL DATA:  NG tube placement EXAM: ABDOMEN - 1 VIEW COMPARISON:  06/03/2024 FINDINGS: Limited field of view for tube placement verification purposes. An enteric tube is present with tip projecting over the left upper quadrant consistent with location in the upper stomach. Proximal side hole projects at the expected location of the EG junction. Advancement of 2-3 cm may provide better placement. Residual contrast material in the renal collecting systems. Gas and stool seen within the upper abdominal colon. No small or large bowel distention. Lung bases are clear. IMPRESSION: Enteric tube has been advanced with tip now projecting over the left upper quadrant consistent with location in the upper stomach with the proximal side-hole still projects near the expected location of the  EG junction and additional advancement may be useful. Electronically Signed   By: Elsie Gravely M.D.   On: 06/03/2024 16:55   DG Abdomen 1 View Result Date: 06/03/2024 CLINICAL DATA:  Nasogastric tube placement. EXAM: DG ABDOMEN 1V COMPARISON:  None Available. FINDINGS: An enteric tube is seen with its  distal tip just beyond the expected region of the gastroesophageal junction. The bowel gas pattern is normal. No radio-opaque calculi or other significant radiographic abnormality are seen. IMPRESSION: Enteric tube positioning, as described above. Further advancement by approximately 6 cm is recommended to achieve more optimal positioning and to reduce the risk of aspiration. Electronically Signed   By: Suzen Dials M.D.   On: 06/03/2024 15:57   CT ABDOMEN PELVIS W CONTRAST Result Date: 06/03/2024 EXAM: CT ABDOMEN AND PELVIS WITH CONTRAST 06/03/2024 01:07:20 PM TECHNIQUE: CT of the abdomen and pelvis was performed with the administration of 100 mL of iohexol (OMNIPAQUE) 300 MG/ML solution. Multiplanar reformatted images are provided for review. Automated exposure control, iterative reconstruction, and/or weight-based adjustment of the mA/kV was utilized to reduce the radiation dose to as low as reasonably achievable. COMPARISON: None available. CLINICAL HISTORY: Bowel obstruction suspected; LLQ abdominal pain. Arrives from [facility] with c/o abd pain, bloating and nausea x 1 day. FINDINGS: LOWER CHEST: No acute abnormality. LIVER: Mild fatty infiltration of the liver is noted. No discrete lesions are present. GALLBLADDER AND BILE DUCTS: Gallbladder is unremarkable. No biliary ductal dilatation. SPLEEN: No acute abnormality. PANCREAS: No acute abnormality. ADRENAL GLANDS: No acute abnormality. KIDNEYS, URETERS AND BLADDER: Layering calcifications are present in an otherwise simple cyst in the upper pole of the left kidney measuring 2.7 x 2.9 cm. No stones in the kidneys or ureters. No hydronephrosis. No perinephric or periureteral stranding. Urinary bladder is unremarkable. GI AND BOWEL: Proximal small bowel obstruction is present. Dilated loops of small bowel measure up to 2.7 cm. Free fluid is present within the mesentery of the left lower quadrant. Free fluid is present along the left paracolic gutter.  Transition point is in the left lower quadrant. No discrete mass lesion is present. Bowel distal to this area is decompressed. Minimal diverticular disease changes are present in the sigmoid colon without inflammatory changes to suggest diverticulitis. PERITONEUM AND RETROPERITONEUM: Free fluid is present within the mesentery in the left upper quadrant. Free fluid is present along the left paracolic gutter. No free air. VASCULATURE: Aorta is normal in caliber. LYMPH NODES: No lymphadenopathy. REPRODUCTIVE ORGANS: The prostate gland is enlarged measuring 5.7 cm in transverse diameter. BONES AND SOFT TISSUES: Grade 1 anterolisthesis at L3-4 appears chronic. No acute osseous abnormality. No focal soft tissue abnormality. IMPRESSION: 1. Proximal small bowel obstruction with transition point in the left lower quadrant. No discrete mass lesion. Free fluid in the left lower quadrant mesentery and along the left paracolic gutter. 2. Prostatomegaly. Electronically signed by: Lonni Necessary MD 06/03/2024 01:37 PM EDT RP Workstation: [id number]   KUB personally reviewed and still with some mildly dilated loops of small bowel. Anti-infectives: Anti-infectives (From admission, onward)    None       Assessment/Plan:  Patient with surgical history of bilateral inguinal hernias with a CT scan that is concerning for small bowel obstruction.  He does have some free fluid in the left lower quadrant.  Will plan for Gastrografin challenge today.  I discussed with him that if he passes Gastrografin we can may be able to remove NG tube and start on diet tomorrow but if he does not  he may need surgical intervention.    Jayson Endow, M.D. [facility]

## 2024-06-04 NOTE — Progress Notes (Addendum)
 Progress Note    Ronnie Hanson  FMW:[id number] DOB: 05/01/1946  DOA: 06/03/2024 PCP: Lenon Layman ORN, MD      Brief Narrative:    Medical records reviewed and are as summarized below:  Goerge Hanson is a 78 y.o. male with medical history significant for hypertension, hyperlipidemia, CAD, prediabetes, major depressive disorder, who presented to the hospital with abdominal bloating, worsening abdominal pain and nausea of about 2 to 3 days duration.  He had not had a bowel movement during this period.  CT abdomen pelvis: IMPRESSION: 1. Proximal small bowel obstruction with transition point in the left lower quadrant. No discrete mass lesion. Free fluid in the left lower quadrant mesentery and along the left paracolic gutter. 2. Prostatomegaly.   He was admitted to the hospital for small bowel obstruction.   Assessment/Plan:   Principal Problem:   Small bowel obstruction (HCC) Active Problems:   SBO (small bowel obstruction) (HCC)     Body mass index is 20.92 kg/m.   Small bowel obstruction: NG tube to low intermittent wall suction for gastric decompression.  Plan for Gastrografin study today. He is NPO.  Continue IV fluids for hydration.  Analgesics as needed for pain.  Monitor electrolytes.  Follow-up with general surgeon.   Parkinson's disease: Resume carbidopa-levodopa.   Hypertension: Amlodipine and carvedilol on hold   Comorbidities include hyperlipidemia, depression   Diet Order             Diet NPO time specified  Diet effective now                                  Consultants: General Surgeon  Procedures: None    Medications:    carbidopa-levodopa  1 tablet Oral TID   heparin   5,000 Units Subcutaneous Q8H   traZODone  50 mg Per Tube QHS   Continuous Infusions:  lactated ringers  75 mL/hr at 06/04/24 1514     Anti-infectives (From admission, onward)    None               Family Communication/Anticipated D/C date and plan/Code Status   DVT prophylaxis: heparin  injection 5,000 Units Start: 06/03/24 2200     Code Status: Full Code  Family Communication: Male family member at the bedside with patient's permission  Disposition Plan: Plan to discharge home   Status is: Inpatient Remains inpatient appropriate because: Small bowel obstruction       Subjective:   Interval events noted.  He complains of abdominal pain.  No nausea or vomiting.  Male family member at the bedside.  Objective:    Vitals:   06/03/24 2100 06/03/24 2357 06/04/24 0402 06/04/24 0901  BP:  (!) 145/82 135/65 137/72  Pulse:  82 83 74  Resp:   17 15  Temp:   97.9 F (36.6 C) (!) 97.5 F (36.4 C)  TempSrc:    Oral  SpO2:   93% 95%  Weight: 60.6 kg     Height: 5' 7 (1.702 m)      No data found.   Intake/Output Summary (Last 24 hours) at 06/04/2024 1559 Last data filed at 06/04/2024 1514 Gross per 24 hour  Intake 790.34 ml  Output 1325 ml  Net -534.66 ml   Filed Weights   06/03/24 1011 06/03/24 2100  Weight: 64.4 kg 60.6 kg    Exam:  GEN: NAD SKIN: Warm and dry EYES: No pallor  or icterus ENT: MMM CV: RRR PULM: CTA B ABD: soft, ND, mild abdominal tenderness without rebound tenderness or guarding, +BS CNS: AAO x 3, non focal EXT: No edema or tenderness        Data Reviewed:   I have personally reviewed following labs and imaging studies:  Labs: Labs show the following:   Basic Metabolic Panel: Recent Labs  Lab 06/03/24 1011 06/04/24 0442  NA 138 136  K 4.4 4.0  CL 99 101  CO2 30 27  GLUCOSE 114* 97  BUN 25* 19  CREATININE 1.07 1.08  CALCIUM 9.2 8.7*   GFR Estimated Creatinine Clearance: 48.3 mL/min (by C-G formula based on SCr of 1.08 mg/dL). Liver Function Tests: Recent Labs  Lab 06/03/24 1011  AST 19  ALT 6  ALKPHOS 55  BILITOT 0.9  PROT 7.5  ALBUMIN 4.7   Recent Labs  Lab 06/03/24 1011  LIPASE 26    No results for input(s): AMMONIA in the last 168 hours. Coagulation profile No results for input(s): INR, PROTIME in the last 168 hours.  CBC: Recent Labs  Lab 06/03/24 1011 06/04/24 0442  WBC 9.5 9.2  HGB 14.0 12.8*  HCT 42.8 36.9*  MCV 93.2 91.1  PLT 238 223   Cardiac Enzymes: No results for input(s): CKTOTAL, CKMB, CKMBINDEX, TROPONINI in the last 168 hours. BNP (last 3 results) No results for input(s): PROBNP in the last 8760 hours. CBG: No results for input(s): GLUCAP in the last 168 hours. D-Dimer: No results for input(s): DDIMER in the last 72 hours. Hgb A1c: No results for input(s): HGBA1C in the last 72 hours. Lipid Profile: No results for input(s): CHOL, HDL, LDLCALC, TRIG, CHOLHDL, LDLDIRECT in the last 72 hours. Thyroid function studies: No results for input(s): TSH, T4TOTAL, T3FREE, THYROIDAB in the last 72 hours.  Invalid input(s): FREET3 Anemia work up: No results for input(s): VITAMINB12, FOLATE, FERRITIN, TIBC, IRON, RETICCTPCT in the last 72 hours. Sepsis Labs: Recent Labs  Lab 06/03/24 1011 06/04/24 0442  WBC 9.5 9.2    Microbiology No results found for this or any previous visit (from the past 240 hours).  Procedures and diagnostic studies:  DG Abd Portable 1V-Small Bowel Protocol-Position Verification Result Date: 06/04/2024 EXAM: 1 VIEW XRAY OF THE ABDOMEN 06/04/2024 09:29:01 AM COMPARISON: 06/03/2024 CLINICAL HISTORY: Encounter for imaging study to confirm nasogastric (NG) tube placement 747666. Encounter for NG tube placement. FINDINGS: LINES, TUBES AND DEVICES: Enteric tube in place with distal tip and side port terminating within the expected location of the gastric body. BOWEL: Few dilated loops of small bowel within the left hemiabdomen, similar to the prior exam. SOFT TISSUES: No opaque urinary calculi. BONES: No acute osseous abnormality. IMPRESSION: 1. Enteric tube appropriately  positioned with tip and side port in the gastric body. 2. Few dilated small-bowel loops in the left abdomen, similar to prior. Electronically signed by: Waddell Calk MD 06/04/2024 10:04 AM EDT RP Workstation: [id number]   DG Abd 1 View Result Date: 06/03/2024 CLINICAL DATA:  Check gastric catheter placement EXAM: ABDOMEN - 1 VIEW COMPARISON:  Film from earlier in the same day. FINDINGS: Gastric catheter has been advanced deeper into the stomach. Proximal side port now lies within the gastric lumen. IMPRESSION: Gastric catheter as described in satisfactory position. Electronically Signed   By: Oneil Devonshire M.D.   On: 06/03/2024 20:47   DG Abdomen 1 View Result Date: 06/03/2024 CLINICAL DATA:  NG tube placement EXAM: ABDOMEN - 1 VIEW COMPARISON:  06/03/2024 FINDINGS: Limited  field of view for tube placement verification purposes. An enteric tube is present with tip projecting over the left upper quadrant consistent with location in the upper stomach. Proximal side hole projects at the expected location of the EG junction. Advancement of 2-3 cm may provide better placement. Residual contrast material in the renal collecting systems. Gas and stool seen within the upper abdominal colon. No small or large bowel distention. Lung bases are clear. IMPRESSION: Enteric tube has been advanced with tip now projecting over the left upper quadrant consistent with location in the upper stomach with the proximal side-hole still projects near the expected location of the EG junction and additional advancement may be useful. Electronically Signed   By: Elsie Gravely M.D.   On: 06/03/2024 16:55   DG Abdomen 1 View Result Date: 06/03/2024 CLINICAL DATA:  Nasogastric tube placement. EXAM: DG ABDOMEN 1V COMPARISON:  None Available. FINDINGS: An enteric tube is seen with its distal tip just beyond the expected region of the gastroesophageal junction. The bowel gas pattern is normal. No radio-opaque calculi or other  significant radiographic abnormality are seen. IMPRESSION: Enteric tube positioning, as described above. Further advancement by approximately 6 cm is recommended to achieve more optimal positioning and to reduce the risk of aspiration. Electronically Signed   By: Suzen Dials M.D.   On: 06/03/2024 15:57   CT ABDOMEN PELVIS W CONTRAST Result Date: 06/03/2024 EXAM: CT ABDOMEN AND PELVIS WITH CONTRAST 06/03/2024 01:07:20 PM TECHNIQUE: CT of the abdomen and pelvis was performed with the administration of 100 mL of iohexol (OMNIPAQUE) 300 MG/ML solution. Multiplanar reformatted images are provided for review. Automated exposure control, iterative reconstruction, and/or weight-based adjustment of the mA/kV was utilized to reduce the radiation dose to as low as reasonably achievable. COMPARISON: None available. CLINICAL HISTORY: Bowel obstruction suspected; LLQ abdominal pain. Arrives from [facility] with c/o abd pain, bloating and nausea x 1 day. FINDINGS: LOWER CHEST: No acute abnormality. LIVER: Mild fatty infiltration of the liver is noted. No discrete lesions are present. GALLBLADDER AND BILE DUCTS: Gallbladder is unremarkable. No biliary ductal dilatation. SPLEEN: No acute abnormality. PANCREAS: No acute abnormality. ADRENAL GLANDS: No acute abnormality. KIDNEYS, URETERS AND BLADDER: Layering calcifications are present in an otherwise simple cyst in the upper pole of the left kidney measuring 2.7 x 2.9 cm. No stones in the kidneys or ureters. No hydronephrosis. No perinephric or periureteral stranding. Urinary bladder is unremarkable. GI AND BOWEL: Proximal small bowel obstruction is present. Dilated loops of small bowel measure up to 2.7 cm. Free fluid is present within the mesentery of the left lower quadrant. Free fluid is present along the left paracolic gutter. Transition point is in the left lower quadrant. No discrete mass lesion is present. Bowel distal to this area is decompressed. Minimal diverticular  disease changes are present in the sigmoid colon without inflammatory changes to suggest diverticulitis. PERITONEUM AND RETROPERITONEUM: Free fluid is present within the mesentery in the left upper quadrant. Free fluid is present along the left paracolic gutter. No free air. VASCULATURE: Aorta is normal in caliber. LYMPH NODES: No lymphadenopathy. REPRODUCTIVE ORGANS: The prostate gland is enlarged measuring 5.7 cm in transverse diameter. BONES AND SOFT TISSUES: Grade 1 anterolisthesis at L3-4 appears chronic. No acute osseous abnormality. No focal soft tissue abnormality. IMPRESSION: 1. Proximal small bowel obstruction with transition point in the left lower quadrant. No discrete mass lesion. Free fluid in the left lower quadrant mesentery and along the left paracolic gutter. 2. Prostatomegaly. Electronically signed by:  Lonni Necessary MD 06/03/2024 01:37 PM EDT RP Workstation: [id number]               LOS: 1 day   Tabatha Razzano  [facility]   Pager on [email or web address]. If 7PM-7AM, please contact night-coverage at [email or web address]     06/04/2024, 3:59 PM

## 2024-06-05 DIAGNOSIS — K56609 Unspecified intestinal obstruction, unspecified as to partial versus complete obstruction: Secondary | ICD-10-CM | POA: Diagnosis not present

## 2024-06-05 LAB — RENAL FUNCTION PANEL
Albumin: 3.3 g/dL — ABNORMAL LOW (ref 3.5–5.0)
Anion gap: 9 (ref 5–15)
BUN: 23 mg/dL (ref 8–23)
CO2: 30 mmol/L (ref 22–32)
Calcium: 8.5 mg/dL — ABNORMAL LOW (ref 8.9–10.3)
Chloride: 100 mmol/L (ref 98–111)
Creatinine, Ser: 0.91 mg/dL (ref 0.61–1.24)
GFR, Estimated: >60 mL/min (ref >60)
Glucose, Bld: 81 mg/dL (ref 70–99)
Phosphorus: 2.8 mg/dL (ref 2.5–4.6)
Potassium: 3.7 mmol/L (ref 3.5–5.1)
Sodium: 139 mmol/L (ref 135–145)

## 2024-06-05 LAB — MAGNESIUM: Magnesium: 2 mg/dL (ref 1.7–2.4)

## 2024-06-05 MED ORDER — CARBIDOPA-LEVODOPA 25-100 MG PO TABS
1.0000 | ORAL_TABLET | Freq: Three times a day (TID) | ORAL | Status: DC
  Administered 2024-06-05: 1 via ORAL
  Filled 2024-06-05: qty 1

## 2024-06-05 MED ORDER — AMLODIPINE BESYLATE 5 MG PO TABS: 2.5000 mg | ORAL_TABLET | Freq: Every day | ORAL | Status: DC

## 2024-06-05 MED ORDER — SIMVASTATIN 20 MG PO TABS: 20.0000 mg | ORAL_TABLET | Freq: Every day | ORAL | Status: DC

## 2024-06-05 MED ORDER — CARVEDILOL 25 MG PO TABS
25.0000 mg | ORAL_TABLET | Freq: Two times a day (BID) | ORAL | Status: DC
  Administered 2024-06-05: 25 mg via ORAL
  Filled 2024-06-05: qty 1

## 2024-06-05 MED ORDER — SERTRALINE HCL 50 MG PO TABS
50.0000 mg | ORAL_TABLET | Freq: Every day | ORAL | Status: DC
  Administered 2024-06-05: 50 mg via ORAL
  Filled 2024-06-05: qty 1

## 2024-06-05 MED ORDER — TRAZODONE HCL 50 MG PO TABS
50.0000 mg | ORAL_TABLET | Freq: Every day | ORAL | Status: DC
Start: 2024-06-05 — End: 2024-06-05

## 2024-06-05 NOTE — Progress Notes (Signed)
 CC: SBO Subjective: Patient with Gastrografin challenge yesterday.  Contrast went to the colon and he started to have bowel movements.  Denies any nausea or vomiting.  Denies feeling bloated anymore.  Objective: Vital signs in last 24 hours: Temp:  [97.8 F (36.6 C)-99.9 F (37.7 C)] 99.9 F (37.7 C) (10/12 0709) Pulse Rate:  [71-77] 76 (10/12 0709) Resp:  [15-20] 20 (10/12 0709) BP: (126-165)/(70-87) 144/71 (10/12 0709) SpO2:  [97 %-98 %] 97 % (10/12 0709) Last BM Date : 05/31/24  Intake/Output from previous day: 10/11 0701 - 10/12 0700 In: 580.3 [P.O.:200; I.V.:380.3] Out: 1050 [Urine:450; Emesis/NG output:600] Intake/Output this shift: Total I/O In: 220 [P.O.:220] Out: 300 [Urine:300]  Physical exam:  Abdomen is soft, nondistended and nontender.  NG tube removed by the time I got to room.  Lab Results: CBC  Recent Labs    06/03/24 1011 06/04/24 0442  WBC 9.5 9.2  HGB 14.0 12.8*  HCT 42.8 36.9*  PLT 238 223   BMET Recent Labs    06/04/24 0442 06/05/24 0652  NA 136 139  K 4.0 3.7  CL 101 100  CO2 27 30  GLUCOSE 97 81  BUN 19 23  CREATININE 1.08 0.91  CALCIUM 8.7* 8.5*   PT/INR No results for input(s): LABPROT, INR in the last 72 hours. ABG No results for input(s): PHART, HCO3 in the last 72 hours.  Invalid input(s): PCO2, PO2  Studies/Results: DG Abd Portable 1V-Small Bowel Obstruction Protocol-initial, 8 hr delay Result Date: 06/04/2024 CLINICAL DATA:  Small bowel obstruction protocol, 8 hour delay EXAM: PORTABLE ABDOMEN - 1 VIEW COMPARISON:  06/04/2024 FINDINGS: NG tube is in the stomach. Oral contrast material throughout the colon into the rectum. No current dilated small bowel loops noted. No free air or organomegaly. IMPRESSION: No current evidence for small bowel obstruction. Contrast material throughout the colon. Electronically Signed   By: Franky Crease M.D.   On: 06/04/2024 22:06   DG Abd Portable 1V-Small Bowel Protocol-Position  Verification Result Date: 06/04/2024 EXAM: 1 VIEW XRAY OF THE ABDOMEN 06/04/2024 09:29:01 AM COMPARISON: 06/03/2024 CLINICAL HISTORY: Encounter for imaging study to confirm nasogastric (NG) tube placement [id number]. Encounter for NG tube placement. FINDINGS: LINES, TUBES AND DEVICES: Enteric tube in place with distal tip and side port terminating within the expected location of the gastric body. BOWEL: Few dilated loops of small bowel within the left hemiabdomen, similar to the prior exam. SOFT TISSUES: No opaque urinary calculi. BONES: No acute osseous abnormality. IMPRESSION: 1. Enteric tube appropriately positioned with tip and side port in the gastric body. 2. Few dilated small-bowel loops in the left abdomen, similar to prior. Electronically signed by: Waddell Calk MD 06/04/2024 10:04 AM EDT RP Workstation: [id number]   DG Abd 1 View Result Date: 06/03/2024 CLINICAL DATA:  Check gastric catheter placement EXAM: ABDOMEN - 1 VIEW COMPARISON:  Film from earlier in the same day. FINDINGS: Gastric catheter has been advanced deeper into the stomach. Proximal side port now lies within the gastric lumen. IMPRESSION: Gastric catheter as described in satisfactory position. Electronically Signed   By: Oneil Devonshire M.D.   On: 06/03/2024 20:47   DG Abdomen 1 View Result Date: 06/03/2024 CLINICAL DATA:  NG tube placement EXAM: ABDOMEN - 1 VIEW COMPARISON:  06/03/2024 FINDINGS: Limited field of view for tube placement verification purposes. An enteric tube is present with tip projecting over the left upper quadrant consistent with location in the upper stomach. Proximal side hole projects at the expected location of the  EG junction. Advancement of 2-3 cm may provide better placement. Residual contrast material in the renal collecting systems. Gas and stool seen within the upper abdominal colon. No small or large bowel distention. Lung bases are clear. IMPRESSION: Enteric tube has been advanced with tip now projecting  over the left upper quadrant consistent with location in the upper stomach with the proximal side-hole still projects near the expected location of the EG junction and additional advancement may be useful. Electronically Signed   By: Elsie Gravely M.D.   On: 06/03/2024 16:55   DG Abdomen 1 View Result Date: 06/03/2024 CLINICAL DATA:  Nasogastric tube placement. EXAM: DG ABDOMEN 1V COMPARISON:  None Available. FINDINGS: An enteric tube is seen with its distal tip just beyond the expected region of the gastroesophageal junction. The bowel gas pattern is normal. No radio-opaque calculi or other significant radiographic abnormality are seen. IMPRESSION: Enteric tube positioning, as described above. Further advancement by approximately 6 cm is recommended to achieve more optimal positioning and to reduce the risk of aspiration. Electronically Signed   By: Suzen Dials M.D.   On: 06/03/2024 15:57   CT ABDOMEN PELVIS W CONTRAST Result Date: 06/03/2024 EXAM: CT ABDOMEN AND PELVIS WITH CONTRAST 06/03/2024 01:07:20 PM TECHNIQUE: CT of the abdomen and pelvis was performed with the administration of 100 mL of iohexol (OMNIPAQUE) 300 MG/ML solution. Multiplanar reformatted images are provided for review. Automated exposure control, iterative reconstruction, and/or weight-based adjustment of the mA/kV was utilized to reduce the radiation dose to as low as reasonably achievable. COMPARISON: None available. CLINICAL HISTORY: Bowel obstruction suspected; LLQ abdominal pain. Ar[facility] from [facility] with c/o abd pain, bloating and nausea x 1 day. FINDINGS: LOWER CHEST: No acute abnormality. LIVER: Mild fatty infiltration of the liver is noted. No discrete lesions are present. GALLBLADDER AND BILE DUCTS: Gallbladder is unremarkable. No biliary ductal dilatation. SPLEEN: No acute abnormality. PANCREAS: No acute abnormality. ADRENAL GLANDS: No acute abnormality. KIDNEYS, URETERS AND BLADDER: Layering calcifications are present in  an otherwise simple cyst in the upper pole of the left kidney measuring 2.7 x 2.9 cm. No stones in the kidneys or ureters. No hydronephrosis. No perinephric or periureteral stranding. Urinary bladder is unremarkable. GI AND BOWEL: Proximal small bowel obstruction is present. Dilated loops of small bowel measure up to 2.7 cm. Free fluid is present within the mesentery of the left lower quadrant. Free fluid is present along the left paracolic gutter. Transition point is in the left lower quadrant. No discrete mass lesion is present. Bowel distal to this area is decompressed. Minimal diverticular disease changes are present in the sigmoid colon without inflammatory changes to suggest diverticulitis. PERITONEUM AND RETROPERITONEUM: Free fluid is present within the mesentery in the left upper quadrant. Free fluid is present along the left paracolic gutter. No free air. VASCULATURE: Aorta is normal in caliber. LYMPH NODES: No lymphadenopathy. REPRODUCTIVE ORGANS: The prostate gland is enlarged measuring 5.7 cm in transverse diameter. BONES AND SOFT TISSUES: Grade 1 anterolisthesis at L3-4 appears chronic. No acute osseous abnormality. No focal soft tissue abnormality. IMPRESSION: 1. Proximal small bowel obstruction with transition point in the left lower quadrant. No discrete mass lesion. Free fluid in the left lower quadrant mesentery and along the left paracolic gutter. 2. Prostatomegaly. Electronically signed by: Lonni Necessary MD 06/03/2024 01:37 PM EDT RP Workstation: [id number]   KUB personally reviewed and contrast into the colon Anti-infectives: Anti-infectives (From admission, onward)    None       Assessment/Plan:  Patient  with surgical history of bilateral inguinal hernias with a CT scan that is concerning for small bowel obstruction.  Gastrografin challenge yesterday with contrast into the colon.  Having bowel function.  Can start on clear liquid diet and advance as tolerated.  If able to  tolerate diet can likely be discharged this afternoon   Jayson Endow, M.D. [facility]

## 2024-06-05 NOTE — Progress Notes (Signed)
NG tube removed per order. Patient tolerated well.

## 2024-06-05 NOTE — Discharge Summary (Signed)
 Physician Discharge Summary   Patient: Ronnie Hanson MRN: [id number] DOB: 1946-06-11  Admit date:     06/03/2024  Discharge date: 06/05/24  Discharge Physician: AIDA CHO   PCP: Lenon Layman ORN, MD   Recommendations at discharge:   Follow-up with PCP in 1 to 2 weeks  Discharge Diagnoses: Principal Problem:   Small bowel obstruction (HCC) Active Problems:   SBO (small bowel obstruction) (HCC)  Resolved Problems:   * No resolved hospital problems. *  Hospital Course:  Ronnie Hanson is a 78 y.o. male with medical history significant for hypertension, hyperlipidemia, CAD, prediabetes, major depressive disorder, who presented to the hospital with abdominal bloating, worsening abdominal pain and nausea of about 2 to 3 days duration.  He had not had a bowel movement during this period.   CT abdomen pelvis: IMPRESSION: 1. Proximal small bowel obstruction with transition point in the left lower quadrant. No discrete mass lesion. Free fluid in the left lower quadrant mesentery and along the left paracolic gutter. 2. Prostatomegaly.     He was admitted to the hospital for small bowel obstruction.  Assessment and Plan:   Small bowel obstruction: Gastrografin challenge was performed and contrast cut into the colon and rectum.  He was able to move his bowels.  Symptoms have resolved.  NG tube was removed and he has tolerated a soft diet without any problems. He is okay for discharge from surgeon's standpoint.    Parkinson's disease: Continue carbidopa-levodopa at discharge.     Hypertension: Continue amlodipine and carvedilol at discharge     Comorbidities include hyperlipidemia, depression   His condition has improved and he is deemed stable for discharge to home today.      Consultants: General Surgeon Procedures performed: None Disposition: Home Diet recommendation:  Discharge Diet Orders (From admission, onward)     Start      Ordered   06/05/24 0000  Diet - low sodium heart healthy        06/05/24 1409           Cardiac diet DISCHARGE MEDICATION: Allergies as of 06/05/2024       Reactions   Sulfa Antibiotics Hives   Other Dermatitis   Chlorhexidine          Medication List     STOP taking these medications    psyllium 0.52 g capsule Commonly known as: REGULOID       TAKE these medications    amLODipine 2.5 MG tablet Commonly known as: NORVASC Take 2.5 mg by mouth daily.   aspirin EC 81 MG tablet Take 81 mg by mouth daily.   carbidopa-levodopa 25-100 MG tablet Commonly known as: SINEMET IR Take 1 tablet by mouth 3 (three) times daily.   carvedilol 25 MG tablet Commonly known as: COREG Take 25 mg by mouth 2 (two) times daily.   CENTRUM SILVER 50+MEN PO Take 1 tablet by mouth daily.   polyethylene glycol 17 g packet Commonly known as: MIRALAX / GLYCOLAX Take 17 g by mouth daily as needed.   sertraline 50 MG tablet Commonly known as: ZOLOFT Take 50 mg by mouth daily.   simvastatin 20 MG tablet Commonly known as: ZOCOR Take 20 mg by mouth at bedtime.   traZODone 50 MG tablet Commonly known as: DESYREL Take 50 mg by mouth at bedtime.        Discharge Exam: Filed Weights   06/03/24 1011 06/03/24 2100  Weight: 64.4 kg 60.6 kg   GEN: NAD SKIN: Warm  and dry EYES: No pallor or icterus ENT: MMM CV: RRR PULM: CTA B ABD: soft, ND, NT, +BS CNS: AAO x 3, non focal EXT: No edema or tenderness   Condition at discharge: good  The results of significant diagnostics from this hospitalization (including imaging, microbiology, ancillary and laboratory) are listed below for reference.   Imaging Studies: DG Abd Portable 1V-Small Bowel Obstruction Protocol-initial, 8 hr delay Result Date: 06/04/2024 CLINICAL DATA:  Small bowel obstruction protocol, 8 hour delay EXAM: PORTABLE ABDOMEN - 1 VIEW COMPARISON:  06/04/2024 FINDINGS: NG tube is in the stomach. Oral contrast  material throughout the colon into the rectum. No current dilated small bowel loops noted. No free air or organomegaly. IMPRESSION: No current evidence for small bowel obstruction. Contrast material throughout the colon. Electronically Signed   By: Franky Crease M.D.   On: 06/04/2024 22:06   DG Abd Portable 1V-Small Bowel Protocol-Position Verification Result Date: 06/04/2024 EXAM: 1 VIEW XRAY OF THE ABDOMEN 06/04/2024 09:29:01 AM COMPARISON: 06/03/2024 CLINICAL HISTORY: Encounter for imaging study to confirm nasogastric (NG) tube placement [id number]. Encounter for NG tube placement. FINDINGS: LINES, TUBES AND DEVICES: Enteric tube in place with distal tip and side port terminating within the expected location of the gastric body. BOWEL: Few dilated loops of small bowel within the left hemiabdomen, similar to the prior exam. SOFT TISSUES: No opaque urinary calculi. BONES: No acute osseous abnormality. IMPRESSION: 1. Enteric tube appropriately positioned with tip and side port in the gastric body. 2. Few dilated small-bowel loops in the left abdomen, similar to prior. Electronically signed by: Waddell Calk MD 06/04/2024 10:04 AM EDT RP Workstation: [id number]   DG Abd 1 View Result Date: 06/03/2024 CLINICAL DATA:  Check gastric catheter placement EXAM: ABDOMEN - 1 VIEW COMPARISON:  Film from earlier in the same day. FINDINGS: Gastric catheter has been advanced deeper into the stomach. Proximal side port now lies within the gastric lumen. IMPRESSION: Gastric catheter as described in satisfactory position. Electronically Signed   By: Oneil Devonshire M.D.   On: 06/03/2024 20:47   DG Abdomen 1 View Result Date: 06/03/2024 CLINICAL DATA:  NG tube placement EXAM: ABDOMEN - 1 VIEW COMPARISON:  06/03/2024 FINDINGS: Limited field of view for tube placement verification purposes. An enteric tube is present with tip projecting over the left upper quadrant consistent with location in the upper stomach. Proximal side hole  projects at the expected location of the EG junction. Advancement of 2-3 cm may provide better placement. Residual contrast material in the renal collecting systems. Gas and stool seen within the upper abdominal colon. No small or large bowel distention. Lung bases are clear. IMPRESSION: Enteric tube has been advanced with tip now projecting over the left upper quadrant consistent with location in the upper stomach with the proximal side-hole still projects near the expected location of the EG junction and additional advancement may be useful. Electronically Signed   By: Elsie Gravely M.D.   On: 06/03/2024 16:55   DG Abdomen 1 View Result Date: 06/03/2024 CLINICAL DATA:  Nasogastric tube placement. EXAM: DG ABDOMEN 1V COMPARISON:  None Available. FINDINGS: An enteric tube is seen with its distal tip just beyond the expected region of the gastroesophageal junction. The bowel gas pattern is normal. No radio-opaque calculi or other significant radiographic abnormality are seen. IMPRESSION: Enteric tube positioning, as described above. Further advancement by approximately 6 cm is recommended to achieve more optimal positioning and to reduce the risk of aspiration. Electronically Signed   By: Suzen  Houston M.D.   On: 06/03/2024 15:57   CT ABDOMEN PELVIS W CONTRAST Result Date: 06/03/2024 EXAM: CT ABDOMEN AND PELVIS WITH CONTRAST 06/03/2024 01:07:20 PM TECHNIQUE: CT of the abdomen and pelvis was performed with the administration of 100 mL of iohexol (OMNIPAQUE) 300 MG/ML solution. Multiplanar reformatted images are provided for review. Automated exposure control, iterative reconstruction, and/or weight-based adjustment of the mA/kV was utilized to reduce the radiation dose to as low as reasonably achievable. COMPARISON: None available. CLINICAL HISTORY: Bowel obstruction suspected; LLQ abdominal pain. Arrives from [facility] with c/o abd pain, bloating and nausea x 1 day. FINDINGS: LOWER CHEST: No acute  abnormality. LIVER: Mild fatty infiltration of the liver is noted. No discrete lesions are present. GALLBLADDER AND BILE DUCTS: Gallbladder is unremarkable. No biliary ductal dilatation. SPLEEN: No acute abnormality. PANCREAS: No acute abnormality. ADRENAL GLANDS: No acute abnormality. KIDNEYS, URETERS AND BLADDER: Layering calcifications are present in an otherwise simple cyst in the upper pole of the left kidney measuring 2.7 x 2.9 cm. No stones in the kidneys or ureters. No hydronephrosis. No perinephric or periureteral stranding. Urinary bladder is unremarkable. GI AND BOWEL: Proximal small bowel obstruction is present. Dilated loops of small bowel measure up to 2.7 cm. Free fluid is present within the mesentery of the left lower quadrant. Free fluid is present along the left paracolic gutter. Transition point is in the left lower quadrant. No discrete mass lesion is present. Bowel distal to this area is decompressed. Minimal diverticular disease changes are present in the sigmoid colon without inflammatory changes to suggest diverticulitis. PERITONEUM AND RETROPERITONEUM: Free fluid is present within the mesentery in the left upper quadrant. Free fluid is present along the left paracolic gutter. No free air. VASCULATURE: Aorta is normal in caliber. LYMPH NODES: No lymphadenopathy. REPRODUCTIVE ORGANS: The prostate gland is enlarged measuring 5.7 cm in transverse diameter. BONES AND SOFT TISSUES: Grade 1 anterolisthesis at L3-4 appears chronic. No acute osseous abnormality. No focal soft tissue abnormality. IMPRESSION: 1. Proximal small bowel obstruction with transition point in the left lower quadrant. No discrete mass lesion. Free fluid in the left lower quadrant mesentery and along the left paracolic gutter. 2. Prostatomegaly. Electronically signed by: Lonni Necessary MD 06/03/2024 01:37 PM EDT RP Workstation: [id number]    Microbiology: No results found for this or any previous  visit.  Labs: CBC: Recent Labs  Lab 06/03/24 1011 06/04/24 0442  WBC 9.5 9.2  HGB 14.0 12.8*  HCT 42.8 36.9*  MCV 93.2 91.1  PLT 238 223   Basic Metabolic Panel: Recent Labs  Lab 06/03/24 1011 06/04/24 0442 06/05/24 0652  NA 138 136 139  K 4.4 4.0 3.7  CL 99 101 100  CO2 30 27 30   GLUCOSE 114* 97 81  BUN 25* 19 23  CREATININE 1.07 1.08 0.91  CALCIUM 9.2 8.7* 8.5*  MG  --   --  2.0  PHOS  --   --  2.8   Liver Function Tests: Recent Labs  Lab 06/03/24 1011 06/05/24 0652  AST 19  --   ALT 6  --   ALKPHOS 55  --   BILITOT 0.9  --   PROT 7.5  --   ALBUMIN 4.7 3.3*   CBG: No results for input(s): GLUCAP in the last 168 hours.  Discharge time spent: greater than 30 minutes.  Signed: AIDA CHO, MD [facility] 06/05/2024

## 2024-06-05 NOTE — Plan of Care (Signed)

## 2024-06-05 NOTE — Progress Notes (Signed)
 Discharge instructions given to patient, questions answered. IV removed without complications. Patient transporting home via car by his daughter.

## 2024-06-09 DIAGNOSIS — K56609 Unspecified intestinal obstruction, unspecified as to partial versus complete obstruction: Secondary | ICD-10-CM | POA: Diagnosis not present

## 2024-06-17 DIAGNOSIS — R2689 Other abnormalities of gait and mobility: Secondary | ICD-10-CM | POA: Diagnosis not present

## 2024-06-17 DIAGNOSIS — R293 Abnormal posture: Secondary | ICD-10-CM | POA: Diagnosis not present

## 2024-06-17 DIAGNOSIS — R269 Unspecified abnormalities of gait and mobility: Secondary | ICD-10-CM | POA: Diagnosis not present

## 2024-06-17 DIAGNOSIS — R29898 Other symptoms and signs involving the musculoskeletal system: Secondary | ICD-10-CM | POA: Diagnosis not present

## 2024-06-27 DIAGNOSIS — R269 Unspecified abnormalities of gait and mobility: Secondary | ICD-10-CM | POA: Diagnosis not present

## 2024-06-27 DIAGNOSIS — R2689 Other abnormalities of gait and mobility: Secondary | ICD-10-CM | POA: Diagnosis not present

## 2024-06-27 DIAGNOSIS — R293 Abnormal posture: Secondary | ICD-10-CM | POA: Diagnosis not present

## 2024-06-27 DIAGNOSIS — R29898 Other symptoms and signs involving the musculoskeletal system: Secondary | ICD-10-CM | POA: Diagnosis not present

## 2024-07-06 DIAGNOSIS — R29898 Other symptoms and signs involving the musculoskeletal system: Secondary | ICD-10-CM | POA: Diagnosis not present

## 2024-07-25 DIAGNOSIS — G20A1 Parkinson's disease without dyskinesia, without mention of fluctuations: Secondary | ICD-10-CM | POA: Diagnosis not present

## 2024-07-25 DIAGNOSIS — R29898 Other symptoms and signs involving the musculoskeletal system: Secondary | ICD-10-CM | POA: Diagnosis not present

## 2024-07-25 DIAGNOSIS — R296 Repeated falls: Secondary | ICD-10-CM | POA: Diagnosis not present

## 2024-07-25 DIAGNOSIS — R498 Other voice and resonance disorders: Secondary | ICD-10-CM | POA: Diagnosis not present

## 2024-07-25 DIAGNOSIS — R2689 Other abnormalities of gait and mobility: Secondary | ICD-10-CM | POA: Diagnosis not present

## 2024-07-25 DIAGNOSIS — M2560 Stiffness of unspecified joint, not elsewhere classified: Secondary | ICD-10-CM | POA: Diagnosis not present
